# Patient Record
Sex: Female | Born: 1980
Health system: Southern US, Community
[De-identification: ages and names within clinical notes are randomized; demographics above are authoritative.]

## PROBLEM LIST (undated history)

## (undated) DIAGNOSIS — J301 Allergic rhinitis due to pollen: Secondary | ICD-10-CM

## (undated) DIAGNOSIS — K219 Gastro-esophageal reflux disease without esophagitis: Secondary | ICD-10-CM

## (undated) DIAGNOSIS — B019 Varicella without complication: Secondary | ICD-10-CM

## (undated) DIAGNOSIS — R87619 Unspecified abnormal cytological findings in specimens from cervix uteri: Secondary | ICD-10-CM

## (undated) DIAGNOSIS — B977 Papillomavirus as the cause of diseases classified elsewhere: Secondary | ICD-10-CM

## (undated) HISTORY — DX: Unspecified abnormal cytological findings in specimens from cervix uteri: R87.619

## (undated) HISTORY — DX: Papillomavirus as the cause of diseases classified elsewhere: B97.7

## (undated) HISTORY — PX: WISDOM TOOTH EXTRACTION: SHX21

## (undated) HISTORY — DX: Allergic rhinitis due to pollen: J30.1

## (undated) HISTORY — PX: COLONOSCOPY: SHX174

## (undated) HISTORY — PX: CRYOTHERAPY: SHX1416

## (undated) HISTORY — DX: Varicella without complication: B01.9

---

## 2016-11-24 ENCOUNTER — Encounter: Payer: Self-pay | Admitting: Obstetrics and Gynecology

## 2016-11-24 ENCOUNTER — Ambulatory Visit (INDEPENDENT_AMBULATORY_CARE_PROVIDER_SITE_OTHER): Payer: BLUE CROSS/BLUE SHIELD | Admitting: Obstetrics and Gynecology

## 2016-11-24 VITALS — BP 118/74 | Ht 63.0 in | Wt 182.0 lb

## 2016-11-24 DIAGNOSIS — Z124 Encounter for screening for malignant neoplasm of cervix: Secondary | ICD-10-CM | POA: Diagnosis not present

## 2016-11-24 DIAGNOSIS — Z1331 Encounter for screening for depression: Secondary | ICD-10-CM

## 2016-11-24 DIAGNOSIS — Z1339 Encounter for screening examination for other mental health and behavioral disorders: Secondary | ICD-10-CM

## 2016-11-24 DIAGNOSIS — Z01419 Encounter for gynecological examination (general) (routine) without abnormal findings: Secondary | ICD-10-CM | POA: Diagnosis not present

## 2016-11-24 DIAGNOSIS — Z1389 Encounter for screening for other disorder: Secondary | ICD-10-CM

## 2016-11-24 NOTE — Progress Notes (Signed)
Gynecology Annual Exam  PCP: Patient, No Pcp Per  Chief Complaint  Patient presents with  . Annual Exam    History of Present Illness:  [name] is a 36 y.o. [id number] who LMP was No LMP recorded. Patient is not currently having periods (Reason: IUD)., presents today for her annual examination.  Her menses are absent.  She is single partner, contraception - IUD.  Last Pap: several years ago Hx of STDs: denies  Last mammogram: n/a There is no FH of breast cancer. There is no FH of ovarian cancer. The patient does not do self-breast exams.  Tobacco use: The patient denies current or previous tobacco use. Alcohol use: none Exercise: not active  The patient wears seatbelts: yes.      Review of Systems: Review of Systems  Constitutional: Negative.   HENT: Negative.   Eyes: Negative.   Respiratory: Negative.   Cardiovascular: Negative.   Gastrointestinal: Negative.   Genitourinary: Negative.   Musculoskeletal: Negative.   Skin: Negative.   Neurological: Negative.   Psychiatric/Behavioral: Negative.     Past Medical History:  Diagnosis Date  . Abnormal Pap smear of cervix   . Human papilloma virus     Past Surgical History:  Procedure Laterality Date  . CESAREAN SECTION    . CRYOTHERAPY      Medications:   Medication Sig Start Date End Date Taking? Authorizing Provider  cetirizine (ZYRTEC) 10 MG tablet Take 10 mg by mouth daily.   Yes [provider]    Allergies: No Known Allergies  Gynecologic History: No LMP recorded. Patient is not currently having periods (Reason: IUD).  Obstetric History: [id number]  Social History   Social History  . Marital status: Single    Spouse name: N/A  . Number of children: N/A  . Years of education: N/A   Occupational History  . Not on file.   Social History Main Topics  . Smoking status: Never Smoker  . Smokeless tobacco: Never Used  . Alcohol use No  . Drug use: No  . Sexual activity: Yes   Birth control/ protection: IUD   Other Topics Concern  . Not on file   Social History Narrative  . No narrative on file    Family History:  History reviewed. No pertinent family history.   Physical Exam BP 118/74   Ht 5\' 3"  (1.6 m)   Wt 182 lb (82.6 kg)   BMI 32.24 kg/m   Physical Exam  Constitutional: She is oriented to person, place, and time. She appears well-developed and well-nourished. No distress.  Genitourinary: Vagina normal and uterus normal. Pelvic exam was performed with patient supine. There is no rash, tenderness, lesion or injury on the right labia. There is no rash, tenderness, lesion or injury on the left labia. Vagina exhibits no lesion. No bleeding in the vagina. No foreign body in the vagina. No signs of injury around the vagina. Right adnexum does not display mass, does not display tenderness and does not display fullness. Left adnexum does not display mass, does not display tenderness and does not display fullness.  Cervix exhibits visible IUD strings. Cervix does not exhibit motion tenderness, lesion, discharge or polyp.   Uterus is anteverted.  HENT:  Head: Normocephalic and atraumatic.  Eyes: EOM are normal. No scleral icterus.  Neck: Normal range of motion. Neck supple. No thyromegaly present.  Cardiovascular: Normal rate, regular rhythm and normal heart sounds.  Exam reveals no gallop and no friction rub.   No  murmur heard. Pulmonary/Chest: Effort normal and breath sounds normal. No respiratory distress. She has no wheezes. She has no rales.  Abdominal: Soft. Bowel sounds are normal. She exhibits no distension and no mass. There is no tenderness. There is no rebound and no guarding.  Musculoskeletal: Normal range of motion. She exhibits no edema.  Lymphadenopathy:    She has no cervical adenopathy.  Neurological: She is alert and oriented to person, place, and time. No cranial nerve deficit.  Skin: Skin is warm and dry. No erythema.  Psychiatric: She  has a normal mood and affect. Her behavior is normal. Judgment normal.   Female chaperone present for pelvic and breast  portions of the physical exam  Results: AUDIT Questionnaire (screen for alcoholism): 0 PHQ-9: 1  Assessment: 36 y.o. K2H0623 here for annual gynecologic examination, doing well  Plan:  Screening: -- Blood pressure screen normal. -- Colonoscopy - not due -- Mammogram - not due.  -- Weight screening: obesity.  Discussed lifestyle, diet, and exercise. Will continue to monitor. -- Depression screening negative (PHQ-9) -- Nutrition: normal -- cholesterol screening: n/a -- osteoporosis screening: n/a -- tobacco screening: not using -- alcohol screening: AUDIT questionnaire indicates low-risk usage. -- family history of breast cancer screening: done. not at high risk. -- no evidence of domestic violence or intimate partner violence. -- STD screening: gonorrhea/chlamydia NAAT not collected per patient request. -- pap smear collected.  Prentice Docker, MD 11/24/2016 9:57 AM

## 2016-11-25 DIAGNOSIS — Z124 Encounter for screening for malignant neoplasm of cervix: Secondary | ICD-10-CM | POA: Diagnosis not present

## 2016-11-25 DIAGNOSIS — Z01419 Encounter for gynecological examination (general) (routine) without abnormal findings: Secondary | ICD-10-CM | POA: Diagnosis not present

## 2016-12-06 LAB — IGP,CTNG,APTIMAHPV,RFX16/18,45
Chlamydia, Nuc. Acid Amp: NEGATIVE
Gonococcus by Nucleic Acid Amp: NEGATIVE
HPV Aptima: POSITIVE — AB
HPV GENOTYPE 16: NEGATIVE
HPV Genotype 18,45: NEGATIVE
PAP SMEAR COMMENT: 0

## 2016-12-08 ENCOUNTER — Encounter: Payer: Self-pay | Admitting: Obstetrics and Gynecology

## 2018-08-14 ENCOUNTER — Encounter: Payer: Self-pay | Admitting: Family Medicine

## 2018-08-14 ENCOUNTER — Ambulatory Visit: Payer: BLUE CROSS/BLUE SHIELD | Admitting: Family Medicine

## 2018-08-14 VITALS — BP 120/82 | HR 94 | Temp 98.7°F | Resp 18 | Ht 64.5 in | Wt 206.6 lb

## 2018-08-14 DIAGNOSIS — D171 Benign lipomatous neoplasm of skin and subcutaneous tissue of trunk: Secondary | ICD-10-CM | POA: Diagnosis not present

## 2018-08-14 DIAGNOSIS — Z8349 Family history of other endocrine, nutritional and metabolic diseases: Secondary | ICD-10-CM | POA: Diagnosis not present

## 2018-08-14 LAB — LIPID PANEL
Cholesterol: 199 mg/dL (ref 0–200)
HDL: 41.5 mg/dL (ref >39.00)
LDL Cholesterol: 134 mg/dL — ABNORMAL HIGH (ref 0–99)
NonHDL: 157.16
TRIGLYCERIDES: 115 mg/dL (ref 0.0–149.0)
Total CHOL/HDL Ratio: 5
VLDL: 23 mg/dL (ref 0.0–40.0)

## 2018-08-14 LAB — COMPREHENSIVE METABOLIC PANEL
ALT: 17 U/L (ref 0–35)
AST: 16 U/L (ref 0–37)
Albumin: 4.4 g/dL (ref 3.5–5.2)
Alkaline Phosphatase: 54 U/L (ref 39–117)
BUN: 17 mg/dL (ref 6–23)
CHLORIDE: 102 meq/L (ref 96–112)
CO2: 25 meq/L (ref 19–32)
CREATININE: 0.69 mg/dL (ref 0.40–1.20)
Calcium: 9.4 mg/dL (ref 8.4–10.5)
GFR: 95.28 mL/min (ref >60.00)
GLUCOSE: 100 mg/dL — AB (ref 70–99)
Potassium: 4.4 mEq/L (ref 3.5–5.1)
SODIUM: 138 meq/L (ref 135–145)
Total Bilirubin: 0.4 mg/dL (ref 0.2–1.2)
Total Protein: 7.1 g/dL (ref 6.0–8.3)

## 2018-08-14 LAB — CBC
HCT: 43.7 % (ref 35.0–45.0)
HEMOGLOBIN: 15.1 g/dL (ref 11.7–15.5)
MCH: 30.8 pg (ref 27.0–33.0)
MCHC: 34.6 g/dL (ref 32.0–36.0)
MCV: 89 fL (ref 80.0–100.0)
MPV: 11.1 fL (ref 7.5–12.5)
Platelets: 282 10*3/uL (ref 140–400)
RBC: 4.91 10*6/uL (ref 3.80–5.10)
RDW: 11.8 % (ref 11.0–15.0)
WBC: 6.8 10*3/uL (ref 3.8–10.8)

## 2018-08-14 LAB — B12 AND FOLATE PANEL: VITAMIN B 12: 684 pg/mL (ref 211–911)

## 2018-08-14 LAB — VITAMIN D 25 HYDROXY (VIT D DEFICIENCY, FRACTURES): VITD: 30.31 ng/mL (ref 30.00–100.00)

## 2018-08-14 NOTE — Patient Instructions (Signed)
Lipoma    A lipoma is a noncancerous (benign) tumor that is made up of fat cells. This is a very common type of soft-tissue growth. Lipomas are usually found under the skin (subcutaneous). They may occur in any tissue of the body that contains fat. Common areas for lipomas to appear include the back, shoulders, buttocks, and thighs.   Lipomas grow slowly, and they are usually painless. Most lipomas do not cause problems and do not require treatment.  What are the causes?  The cause of this condition is not known.  What increases the risk?  You are more likely to develop this condition if:   You are 40-60 years old.   You have a family history of lipomas.  What are the signs or symptoms?  A lipoma usually appears as a small, round bump under the skin. In most cases, the lump will:   Feel soft or rubbery.   Not cause pain or other symptoms.  However, if a lipoma is located in an area where it pushes on nerves, it can become painful or cause other symptoms.  How is this diagnosed?  A lipoma can usually be diagnosed with a physical exam. You may also have tests to confirm the diagnosis and to rule out other conditions. Tests may include:   Imaging tests, such as a CT scan or MRI.   Removal of a tissue sample to be looked at under a microscope (biopsy).  How is this treated?  Treatment for this condition depends on the size of the lipoma and whether it is causing any symptoms.   For small lipomas that are not causing problems, no treatment is needed.   If a lipoma is bigger or it causes problems, surgery may be done to remove the lipoma. Lipomas can also be removed to improve appearance. Most often, the procedure is done after applying a medicine that numbs the area (local anesthetic).  Follow these instructions at home:   Watch your lipoma for any changes.   Keep all follow-up visits as told by your health care provider. This is important.  Contact a health care provider if:   Your lipoma becomes larger or  hard.   Your lipoma becomes painful, red, or increasingly swollen. These could be signs of infection or a more serious condition.  Get help right away if:   You develop tingling or numbness in an area near the lipoma. This could indicate that the lipoma is causing nerve damage.  Summary   A lipoma is a noncancerous tumor that is made up of fat cells.   Most lipomas do not cause problems and do not require treatment.   If a lipoma is bigger or it causes problems, surgery may be done to remove the lipoma.  This information is not intended to replace advice given to you by your health care provider. Make sure you discuss any questions you have with your health care provider.  Document Released: 06/10/2002 Document Revised: 06/06/2017 Document Reviewed: 06/06/2017  Elsevier Interactive Patient Education  2019 [employer]

## 2018-08-14 NOTE — Progress Notes (Signed)
Subjective:    Patient ID: Terry Henderson, female    DOB: [birth year], 38 y.o.   MRN: [id number]  HPI   Patient presents to clinic to establish with PCP.  Overall she is feeling well.  Does have 1 concern of a lump on the back, states this area has seemed to gotten bigger over the past few weeks.  Area is not painful, red.  Area does not drain any fluid or pus.  Patient's last Pap smear was in 2018, normal.  Next Pap due in 2020.  Past medical, family, social surgical history reviewed and updated in chart.  She has no history of any cancers in her family.  She has 2 children ages 56 and 79.  She is married.  She works in the Retail buyer at [employer].   Past Medical History:  Diagnosis Date  . Abnormal Pap smear of cervix   . Chickenpox   . Hay fever   . Human papilloma virus    Social History   Tobacco Use  . Smoking status: Never Smoker  . Smokeless tobacco: Never Used  Substance Use Topics  . Alcohol use: No   Past Surgical History:  Procedure Laterality Date  . CESAREAN SECTION    . CRYOTHERAPY     Family History  Problem Relation Age of Onset  . Hypertension Mother   . Heart attack Maternal Grandmother   . Hyperlipidemia Maternal Grandmother   . Stroke Maternal Grandmother   . Hyperlipidemia Maternal Grandfather   . Heart attack Maternal Grandfather   . Early death Paternal Grandmother    Review of Systems  Constitutional: Negative for chills, fatigue and fever.  HENT: Negative for congestion, ear pain, sinus pain and sore throat.   Eyes: Negative.   Respiratory: Negative for cough, shortness of breath and wheezing.   Cardiovascular: Negative for chest pain, palpitations and leg swelling.  Gastrointestinal: Negative for abdominal pain, diarrhea, nausea and vomiting.  Genitourinary: Negative for dysuria, frequency and urgency.  Musculoskeletal: Negative for arthralgias and myalgias.  Skin: Negative for color change, pallor and rash. +lump on  back Neurological: Negative for syncope, light-headedness and headaches.  Psychiatric/Behavioral: The patient is not nervous/anxious.       Objective:   Physical Exam Vitals signs and nursing note reviewed.  Constitutional:      Appearance: She is well-developed.  HENT:     Head: Normocephalic and atraumatic.     Right Ear: External ear normal.     Left Ear: External ear normal.     Nose: Nose normal.  Eyes:     General: No scleral icterus.       Right eye: No discharge.        Left eye: No discharge.     Conjunctiva/sclera: Conjunctivae normal.     Pupils: Pupils are equal, round, and reactive to light.  Neck:     Musculoskeletal: Normal range of motion and neck supple.     Trachea: No tracheal deviation.  Cardiovascular:     Rate and Rhythm: Normal rate and regular rhythm.     Heart sounds: Normal heart sounds. No murmur. No friction rub. No gallop.   Pulmonary:     Effort: Pulmonary effort is normal. No respiratory distress.     Breath sounds: Normal breath sounds. No wheezing or rales.  Chest:     Chest wall: No tenderness.  Abdominal:     General: Bowel sounds are normal.     Palpations: Abdomen is soft.  Tenderness: There is no abdominal tenderness. There is no guarding.  Musculoskeletal: Normal range of motion.        General: No deformity.  Lymphadenopathy:     Cervical: No cervical adenopathy.  Skin:    General: Skin is warm and dry.     Capillary Refill: Capillary refill takes less than 2 seconds.     Coloration: Skin is not pale.     Findings: No erythema.          Comments: Slightly raised lump felt on mid back, suspect the area is lipoma.  Its diameter is approximately the size of a golf ball.  Neurological:     Mental Status: She is alert and oriented to person, place, and time.     Cranial Nerves: No cranial nerve deficit.  Psychiatric:        Behavior: Behavior normal.        Thought Content: Thought content normal.     Vitals:   08/14/18  0924  BP: 120/82  Pulse: 94  Resp: 18  Temp: 98.7 F (37.1 C)  SpO2: 93%       Assessment & Plan:   Lipoma of back-suspect that the area is a lipoma.  Patient given information outlining what a lipoma is.  Reassured that these are often benign.  She is interested in doing ultrasound imaging to further evaluate.  We will order ultrasound for patient.  Also discussed that if area were to be removed, it would require general surgery consult.  We will see what ultrasound report tells [place] and then if needed we will do general surgery consult.  Family history of nutritional/metabolic syndrome-due to family histories we will do screening lab work today including CBC, CMP, lipid panel, thyroid panel to monitor patient's levels.  Patient will follow-up annually for wellness visit and when needed.  She is aware of some we will contact her in regards to ultrasound appointment.

## 2018-08-15 LAB — THYROID PANEL WITH TSH
Free Thyroxine Index: 2.4 (ref 1.4–3.8)
T3 UPTAKE: 27 % (ref 22–35)
T4, Total: 9 ug/dL (ref 5.1–11.9)
TSH: 1.3 mIU/L

## 2018-08-17 ENCOUNTER — Ambulatory Visit (INDEPENDENT_AMBULATORY_CARE_PROVIDER_SITE_OTHER): Payer: BLUE CROSS/BLUE SHIELD | Admitting: Obstetrics and Gynecology

## 2018-08-17 ENCOUNTER — Encounter: Payer: Self-pay | Admitting: Obstetrics and Gynecology

## 2018-08-17 ENCOUNTER — Other Ambulatory Visit (HOSPITAL_COMMUNITY)
Admission: RE | Admit: 2018-08-17 | Discharge: 2018-08-17 | Disposition: A | Discharge status: Home / Self Care | Payer: BLUE CROSS/BLUE SHIELD | Source: Ambulatory Visit | Attending: Obstetrics and Gynecology | Admitting: Obstetrics and Gynecology

## 2018-08-17 VITALS — BP 122/74 | Ht 64.0 in | Wt 204.0 lb

## 2018-08-17 DIAGNOSIS — Z113 Encounter for screening for infections with a predominantly sexual mode of transmission: Secondary | ICD-10-CM | POA: Insufficient documentation

## 2018-08-17 DIAGNOSIS — Z124 Encounter for screening for malignant neoplasm of cervix: Secondary | ICD-10-CM

## 2018-08-17 DIAGNOSIS — Z30432 Encounter for removal of intrauterine contraceptive device: Secondary | ICD-10-CM | POA: Diagnosis not present

## 2018-08-17 DIAGNOSIS — Z01419 Encounter for gynecological examination (general) (routine) without abnormal findings: Secondary | ICD-10-CM | POA: Insufficient documentation

## 2018-08-17 DIAGNOSIS — Z1339 Encounter for screening examination for other mental health and behavioral disorders: Secondary | ICD-10-CM

## 2018-08-17 DIAGNOSIS — Z1331 Encounter for screening for depression: Secondary | ICD-10-CM

## 2018-08-17 NOTE — Progress Notes (Signed)
Gynecology Annual Exam  PCP: Jodelle Green, FNP   Chief Complaint  Patient presents with  . Annual Exam   History of Present Illness:  Terry Henderson is a 38 y.o. [id number] who LMP was No LMP recorded. (Menstrual status: IUD)., presents today for her annual examination.  Her menses are absent with her IUD.  She is sexually active. She would like her IUD removed to attempt conception.  Last Pap: 11/2016  Results were: no abnormalities /neg HPV DNA positive (negative for HPV 16 and 18). She has a history of cryotherapy twice in the past.  Hx of STDs: none  Last mammogram:  n/a There is no FH of breast cancer. There is no FH of ovarian cancer. The patient does do self-breast exams.  Tobacco use: The patient denies current or previous tobacco use. Alcohol use: none Exercise: not active  The patient wears seatbelts: yes.      Past Medical History:  Diagnosis Date  . Abnormal Pap smear of cervix   . Chickenpox   . Hay fever   . Human papilloma virus     Past Surgical History:  Procedure Laterality Date  . CESAREAN SECTION    . CRYOTHERAPY      Prior to Admission medications   Medication Sig Start Date End Date Taking? Authorizing Provider  levonorgestrel (MIRENA) 20 MCG/24HR IUD 1 each by Intrauterine route once.   Yes [provider]   Allergies: No Known Allergies  Gynecologic History: No LMP recorded. (Menstrual status: IUD).  Obstetric History: [id number], s/p c-section x2  Social History   Socioeconomic History  . Marital status: Married    Spouse name: Not on file  . Number of children: Not on file  . Years of education: Not on file  . Highest education level: Not on file  Occupational History  . Not on file  Social Needs  . Financial resource strain: Not on file  . Food insecurity:    Worry: Not on file    Inability: Not on file  . Transportation needs:    Medical: Not on file    Non-medical: Not on file  Tobacco Use  . Smoking status: Never  Smoker  . Smokeless tobacco: Never Used  Substance and Sexual Activity  . Alcohol use: No  . Drug use: No  . Sexual activity: Yes    Birth control/protection: I.U.D.  Lifestyle  . Physical activity:    Days per week: Not on file    Minutes per session: Not on file  . Stress: Not on file  Relationships  . Social connections:    Talks on phone: Not on file    Gets together: Not on file    Attends religious service: Not on file    Active member of club or organization: Not on file    Attends meetings of clubs or organizations: Not on file    Relationship status: Not on file  . Intimate partner violence:    Fear of current or ex partner: Not on file    Emotionally abused: Not on file    Physically abused: Not on file    Forced sexual activity: Not on file  Other Topics Concern  . Not on file  Social History Narrative  . Not on file    Family History  Problem Relation Age of Onset  . Hypertension Mother   . Heart attack Maternal Grandmother   . Hyperlipidemia Maternal Grandmother   . Stroke Maternal Grandmother   .  Hyperlipidemia Maternal Grandfather   . Heart attack Maternal Grandfather   . Early death Paternal Grandmother     Review of Systems  Constitutional: Negative.   HENT: Negative.   Eyes: Negative.   Respiratory: Negative.   Cardiovascular: Negative.   Gastrointestinal: Negative.   Genitourinary: Negative.   Musculoskeletal: Negative.   Skin: Negative.   Neurological: Negative.   Psychiatric/Behavioral: Negative.      Physical Exam BP 122/74   Ht 5\' 4"  (1.626 m)   Wt 204 lb (92.5 kg)   BMI 35.02 kg/m    Physical Exam Constitutional:      General: She is not in acute distress.    Appearance: She is well-developed.  Genitourinary:     Pelvic exam was performed with patient supine.     Uterus normal.     No inguinal adenopathy present in the right or left side.    No signs of injury in the vagina.     No vaginal discharge, erythema, tenderness  or bleeding.     No cervical motion tenderness, discharge, lesion or polyp.     Uterus is mobile.     Uterus is not enlarged or tender.     No uterine mass detected.    Uterus is anteverted.     No right or left adnexal mass present.     Right adnexa not tender or full.     Left adnexa not tender or full.  HENT:     Head: Normocephalic and atraumatic.  Eyes:     General: No scleral icterus. Neck:     Musculoskeletal: Normal range of motion and neck supple.     Thyroid: No thyromegaly.  Cardiovascular:     Rate and Rhythm: Normal rate and regular rhythm.     Heart sounds: No murmur. No friction rub. No gallop.   Pulmonary:     Effort: Pulmonary effort is normal. No respiratory distress.     Breath sounds: Normal breath sounds. No wheezing or rales.  Chest:     Breasts:        Right: No inverted nipple, mass, nipple discharge, skin change or tenderness.        Left: No inverted nipple, mass, nipple discharge, skin change or tenderness.  Abdominal:     General: Bowel sounds are normal. There is no distension.     Palpations: Abdomen is soft. There is no mass.     Tenderness: There is no abdominal tenderness. There is no guarding or rebound.  Musculoskeletal: Normal range of motion.        General: No tenderness.  Lymphadenopathy:     Cervical: No cervical adenopathy.     Lower Body: No right inguinal adenopathy. No left inguinal adenopathy.  Neurological:     Mental Status: She is alert and oriented to person, place, and time.     Cranial Nerves: No cranial nerve deficit.  Skin:    General: Skin is warm and dry.     Findings: No erythema or rash.  Psychiatric:        Behavior: Behavior normal.        Judgment: Judgment normal.    IUD Removal  Patient identified, informed consent performed, consent signed.  Patient was in the dorsal lithotomy position, normal external genitalia was noted.  A speculum was placed in the patient's vagina, normal discharge was noted, no  lesions. The cervix was visualized, no lesions, no abnormal discharge.  The strings of the IUD were grasped and  pulled using ring forceps. The IUD was removed in its entirety. Patient tolerated the procedure well.    Patient will use plans for pregnancy soon and she was told to avoid teratogens, take PNV and folic acid.  Routine preventative health maintenance measures emphasized.  Female chaperone present for pelvic and breast  portions of the physical exam  Results: AUDIT Questionnaire (screen for alcoholism): 0 PHQ-9: 0   Assessment: 38 y.o. [id number] female here for routine annual gynecologic examination.  Plan: Problem List Items Addressed This Visit    None    Visit Diagnoses    Women's annual routine gynecological examination    -  Primary   Relevant Orders   Cytology - PAP   Screening for depression       Screening for alcoholism       Pap smear for cervical cancer screening       Relevant Orders   Cytology - PAP   Screen for STD (sexually transmitted disease)       Relevant Orders   Cytology - PAP   Encounter for IUD removal          Screening: -- Blood pressure screen normal -- Colonoscopy - not due -- Mammogram - not due -- Weight screening: obese: discussed management options, including lifestyle, dietary, and exercise. -- Depression screening negative (PHQ-9) -- Nutrition: normal -- cholesterol screening: not due for screening -- osteoporosis screening: not due -- tobacco screening: not using -- alcohol screening: AUDIT questionnaire indicates low-risk usage. -- family history of breast cancer screening: done. not at high risk. -- no evidence of domestic violence or intimate partner violence. -- STD screening: gonorrhea/chlamydia NAAT collected -- pap smear collected per ASCCP guidelines -- flu vaccine declines -- HPV vaccination series: not eligilbe   IUD removal: IUD removed today.  Pre-conception anticipatory counseling provided.  Discussed use of PNVs  and avoidance of teratogens and living health, as if she were already pregnant.   Prentice Docker, MD 08/17/2018 5:26 PM

## 2018-08-21 LAB — CYTOLOGY - PAP
CHLAMYDIA, DNA PROBE: NEGATIVE
Diagnosis: NEGATIVE
HPV: NOT DETECTED
Neisseria Gonorrhea: NEGATIVE

## 2018-09-05 ENCOUNTER — Other Ambulatory Visit: Payer: Self-pay

## 2018-09-05 ENCOUNTER — Ambulatory Visit
Admission: RE | Admit: 2018-09-05 | Discharge: 2018-09-05 | Disposition: A | Discharge status: Home / Self Care | Payer: BLUE CROSS/BLUE SHIELD | Source: Ambulatory Visit | Attending: Family Medicine | Admitting: Family Medicine

## 2018-09-05 DIAGNOSIS — R221 Localized swelling, mass and lump, neck: Secondary | ICD-10-CM | POA: Diagnosis not present

## 2018-09-05 DIAGNOSIS — D171 Benign lipomatous neoplasm of skin and subcutaneous tissue of trunk: Secondary | ICD-10-CM | POA: Insufficient documentation

## 2018-09-10 ENCOUNTER — Encounter: Payer: Self-pay | Admitting: Family Medicine

## 2018-11-20 ENCOUNTER — Ambulatory Visit (INDEPENDENT_AMBULATORY_CARE_PROVIDER_SITE_OTHER): Payer: BLUE CROSS/BLUE SHIELD | Admitting: Obstetrics & Gynecology

## 2018-11-20 ENCOUNTER — Other Ambulatory Visit (HOSPITAL_COMMUNITY)
Admission: RE | Admit: 2018-11-20 | Discharge: 2018-11-20 | Disposition: A | Discharge status: Home / Self Care | Payer: BLUE CROSS/BLUE SHIELD | Source: Ambulatory Visit | Attending: Obstetrics & Gynecology | Admitting: Obstetrics & Gynecology

## 2018-11-20 ENCOUNTER — Encounter: Payer: Self-pay | Admitting: Obstetrics & Gynecology

## 2018-11-20 ENCOUNTER — Other Ambulatory Visit: Payer: Self-pay

## 2018-11-20 VITALS — BP 100/70 | Wt 212.0 lb

## 2018-11-20 DIAGNOSIS — Z3201 Encounter for pregnancy test, result positive: Secondary | ICD-10-CM

## 2018-11-20 DIAGNOSIS — O34219 Maternal care for unspecified type scar from previous cesarean delivery: Secondary | ICD-10-CM

## 2018-11-20 DIAGNOSIS — Z98891 History of uterine scar from previous surgery: Secondary | ICD-10-CM | POA: Insufficient documentation

## 2018-11-20 DIAGNOSIS — O99211 Obesity complicating pregnancy, first trimester: Secondary | ICD-10-CM

## 2018-11-20 DIAGNOSIS — O09529 Supervision of elderly multigravida, unspecified trimester: Secondary | ICD-10-CM | POA: Insufficient documentation

## 2018-11-20 DIAGNOSIS — Z3490 Encounter for supervision of normal pregnancy, unspecified, unspecified trimester: Secondary | ICD-10-CM

## 2018-11-20 DIAGNOSIS — Z1379 Encounter for other screening for genetic and chromosomal anomalies: Secondary | ICD-10-CM

## 2018-11-20 DIAGNOSIS — Z131 Encounter for screening for diabetes mellitus: Secondary | ICD-10-CM

## 2018-11-20 DIAGNOSIS — O09521 Supervision of elderly multigravida, first trimester: Secondary | ICD-10-CM

## 2018-11-20 DIAGNOSIS — O99210 Obesity complicating pregnancy, unspecified trimester: Secondary | ICD-10-CM | POA: Insufficient documentation

## 2018-11-20 DIAGNOSIS — N926 Irregular menstruation, unspecified: Secondary | ICD-10-CM

## 2018-11-20 DIAGNOSIS — O0990 Supervision of high risk pregnancy, unspecified, unspecified trimester: Secondary | ICD-10-CM | POA: Insufficient documentation

## 2018-11-20 DIAGNOSIS — Z3A01 Less than 8 weeks gestation of pregnancy: Secondary | ICD-10-CM

## 2018-11-20 LAB — POCT URINALYSIS DIPSTICK OB
Glucose, UA: NEGATIVE
POC,PROTEIN,UA: NEGATIVE

## 2018-11-20 LAB — POCT URINE PREGNANCY: Preg Test, Ur: POSITIVE — AB

## 2018-11-20 NOTE — Patient Instructions (Signed)
First Trimester of Pregnancy The first trimester of pregnancy is from week 1 until the end of week 13 (months 1 through 3). A week after a sperm fertilizes an egg, the egg will implant on the wall of the uterus. This embryo will begin to develop into a baby. Genes from you and your partner will form the baby. The female genes will determine whether the baby will be a boy or a girl. At 6-8 weeks, the eyes and face will be formed, and the heartbeat can be seen on ultrasound. At the end of 12 weeks, all the baby's organs will be formed. Now that you are pregnant, you will want to do everything you can to have a healthy baby. Two of the most important things are to get good prenatal care and to follow your health care provider's instructions. Prenatal care is all the medical care you receive before the baby's birth. This care will help prevent, find, and treat any problems during the pregnancy and childbirth. Body changes during your first trimester Your body goes through many changes during pregnancy. The changes vary from woman to woman.  You may gain or lose a couple of pounds at first.  You may feel sick to your stomach (nauseous) and you may throw up (vomit). If the vomiting is uncontrollable, call your health care provider.  You may tire easily.  You may develop headaches that can be relieved by medicines. All medicines should be approved by your health care provider.  You may urinate more often. Painful urination may mean you have a bladder infection.  You may develop heartburn as a result of your pregnancy.  You may develop constipation because certain hormones are causing the muscles that push stool through your intestines to slow down.  You may develop hemorrhoids or swollen veins (varicose veins).  Your breasts may begin to grow larger and become tender. Your nipples may stick out more, and the tissue that surrounds them (areola) may become darker.  Your gums may bleed and may be  sensitive to brushing and flossing.  Dark spots or blotches (chloasma, mask of pregnancy) may develop on your face. This will likely fade after the baby is born.  Your menstrual periods will stop.  You may have a loss of appetite.  You may develop cravings for certain kinds of food.  You may have changes in your emotions from day to day, such as being excited to be pregnant or being concerned that something may go wrong with the pregnancy and baby.  You may have more vivid and strange dreams.  You may have changes in your hair. These can include thickening of your hair, rapid growth, and changes in texture. Some women also have hair loss during or after pregnancy, or hair that feels dry or thin. Your hair will most likely return to normal after your baby is born. What to expect at prenatal visits During a routine prenatal visit:  You will be weighed to make sure you and the baby are growing normally.  Your blood pressure will be taken.  Your abdomen will be measured to track your baby's growth.  The fetal heartbeat will be listened to between weeks 10 and 14 of your pregnancy.  Test results from any previous visits will be discussed. Your health care provider may ask you:  How you are feeling.  If you are feeling the baby move.  If you have had any abnormal symptoms, such as leaking fluid, bleeding, severe headaches, or abdominal  cramping.  If you are using any tobacco products, including cigarettes, chewing tobacco, and electronic cigarettes.  If you have any questions. Other tests that may be performed during your first trimester include:  Blood tests to find your blood type and to check for the presence of any previous infections. The tests will also be used to check for low iron levels (anemia) and protein on red blood cells (Rh antibodies). Depending on your risk factors, or if you previously had diabetes during pregnancy, you may have tests to check for high blood sugar  that affects pregnant women (gestational diabetes).  Urine tests to check for infections, diabetes, or protein in the urine.  An ultrasound to confirm the proper growth and development of the baby.  Fetal screens for spinal cord problems (spina bifida) and Down syndrome.  HIV (human immunodeficiency virus) testing. Routine prenatal testing includes screening for HIV, unless you choose not to have this test.  You may need other tests to make sure you and the baby are doing well. Follow these instructions at home: Medicines  Follow your health care provider's instructions regarding medicine use. Specific medicines may be either safe or unsafe to take during pregnancy.  Take a prenatal vitamin that contains at least 600 micrograms (mcg) of folic acid.  If you develop constipation, try taking a stool softener if your health care provider approves. Eating and drinking   Eat a balanced diet that includes fresh fruits and vegetables, whole grains, good sources of protein such as meat, eggs, or tofu, and low-fat dairy. Your health care provider will help you determine the amount of weight gain that is right for you.  Avoid raw meat and uncooked cheese. These carry germs that can cause birth defects in the baby.  Eating four or five small meals rather than three large meals a day may help relieve nausea and vomiting. If you start to feel nauseous, eating a few soda crackers can be helpful. Drinking liquids between meals, instead of during meals, also seems to help ease nausea and vomiting.  Limit foods that are high in fat and processed sugars, such as fried and sweet foods.  To prevent constipation: ? Eat foods that are high in fiber, such as fresh fruits and vegetables, whole grains, and beans. ? Drink enough fluid to keep your urine clear or pale yellow. Activity  Exercise only as directed by your health care provider. Most women can continue their usual exercise routine during  pregnancy. Try to exercise for 30 minutes at least 5 days a week. Exercising will help you: ? Control your weight. ? Stay in shape. ? Be prepared for labor and delivery.  Experiencing pain or cramping in the lower abdomen or lower back is a good sign that you should stop exercising. Check with your health care provider before continuing with normal exercises.  Try to avoid standing for long periods of time. Move your legs often if you must stand in one place for a long time.  Avoid heavy lifting.  Wear low-heeled shoes and practice good posture.  You may continue to have sex unless your health care provider tells you not to. Relieving pain and discomfort  Wear a good support bra to relieve breast tenderness.  Take warm sitz baths to soothe any pain or discomfort caused by hemorrhoids. Use hemorrhoid cream if your health care provider approves.  Rest with your legs elevated if you have leg cramps or low back pain.  If you develop varicose veins in  your legs, wear support hose. Elevate your feet for 15 minutes, 3-4 times a day. Limit salt in your diet. Prenatal care  Schedule your prenatal visits by the twelfth week of pregnancy. They are usually scheduled monthly at first, then more often in the last 2 months before delivery.  Write down your questions. Take them to your prenatal visits.  Keep all your prenatal visits as told by your health care provider. This is important. Safety  Wear your seat belt at all times when driving.  Make a list of emergency phone numbers, including numbers for family, friends, the hospital, and police and fire departments. General instructions  Ask your health care provider for a referral to a local prenatal education class. Begin classes no later than the beginning of month 6 of your pregnancy.  Ask for help if you have counseling or nutritional needs during pregnancy. Your health care provider can offer advice or refer you to specialists for help  with various needs.  Do not use hot tubs, steam rooms, or saunas.  Do not douche or use tampons or scented sanitary pads.  Do not cross your legs for long periods of time.  Avoid cat litter boxes and soil used by cats. These carry germs that can cause birth defects in the baby and possibly loss of the fetus by miscarriage or stillbirth.  Avoid all smoking, herbs, alcohol, and medicines not prescribed by your health care provider. Chemicals in these products affect the formation and growth of the baby.  Do not use any products that contain nicotine or tobacco, such as cigarettes and e-cigarettes. If you need help quitting, ask your health care provider. You may receive counseling support and other resources to help you quit.  Schedule a dentist appointment. At home, brush your teeth with a soft toothbrush and be gentle when you floss. Contact a health care provider if:  You have dizziness.  You have mild pelvic cramps, pelvic pressure, or nagging pain in the abdominal area.  You have persistent nausea, vomiting, or diarrhea.  You have a bad smelling vaginal discharge.  You have pain when you urinate.  You notice increased swelling in your face, hands, legs, or ankles.  You are exposed to fifth disease or chickenpox.  You are exposed to German measles (rubella) and have never had it. Get help right away if:  You have a fever.  You are leaking fluid from your vagina.  You have spotting or bleeding from your vagina.  You have severe abdominal cramping or pain.  You have rapid weight gain or loss.  You vomit blood or material that looks like coffee grounds.  You develop a severe headache.  You have shortness of breath.  You have any kind of trauma, such as from a fall or a car accident. Summary  The first trimester of pregnancy is from week 1 until the end of week 13 (months 1 through 3).  Your body goes through many changes during pregnancy. The changes vary from  woman to woman.  You will have routine prenatal visits. During those visits, your health care provider will examine you, discuss any test results you may have, and talk with you about how you are feeling. This information is not intended to replace advice given to you by your health care provider. Make sure you discuss any questions you have with your health care provider. Document Released: 06/14/2001 Document Revised: 06/01/2016 Document Reviewed: 06/01/2016 Elsevier Interactive Patient Education  2019 [employer]      Hello,  Given the current COVID-19 pandemic, our practice is making changes in how we are providing care to our patients. We are limiting in-person visits for the safety of all of our patients.   As a practice, we have met to discuss the best way to minimize visits, but still provide excellent care to our expecting mothers.  We have decided on the following visit structure for low-risk pregnancies.  Initial Pregnancy visit will be conducted as a telephone or web visit.  Between 10-14 weeks  there will be one in-person visit for an ultrasound, lab work, and genetic screening. 20 weeks in-person visit with an anatomy ultrasound  28 weeks in-person office visit for a 1-hour glucose test and a TDAP vaccination 32 weeks in-person office visit 34 weeks telephone visit 36 weeks in-person office visit for GBS, chlamydia, and gonorrhea testing 38 weeks in-person office visit 40 weeks in-person office visit  Understandably, some patients will require more visits than what is outlined above. Additional visits will be determined on a case-by-case basis.   We will, as always, be available for emergencies or to address concerns that might arise between in-person visits. We ask that you allow [place] the opportunity to address any concerns over the phone or through a virtual visit first. We will be available to return your phone calls throughout the day.   If you are able to purchase a  scale, a blood pressure machine, and a home fetal doppler visits could be limited further. This will help decrease your exposure risks, but these purchases are not a necessity.   Things seem to change daily and there is the possibility that this structure could change, please be patient as we adapt to a new way of caring for patients.   Thank you for trusting [place] with your prenatal care. Our practice values you and looks forward to providing you with excellent care.   Sincerely,   [facility], [place]    COVID-19 and Your Pregnancy FAQ  How can I prevent infection with COVID-19 during my pregnancy? Social distancing is key. Please limit any interactions in public. Try and work from home if possible. Frequently wash your hands after touching possibly contaminated surfaces. Avoid touching your face.  Minimize trips to the store. Consider online ordering when possible.   Should I wear a mask? YES. It is recommended by the CDC that all people wear a cloth mask or facial covering in public. You should wear a mask to your visits in the office. This will help reduce transmission as well as your risk or acquiring COVID-19. New studies are showing that even asymptomatic individuals can spread the virus from talking.   Where can I get a mask? [place] and the city of [place] are partnering to provide masks to community members. You can pick up a mask from several locations. This website also has instructions about how to make a mask by sewing or without sewing by using a t-shirt or bandana.  https://www.[place].gov/i-want-to/learn-about/covid-19-information-and-updates/covid-19-face-mask-project  Studies have shown that if you were a tube or nylon stocking from pantyhose over a cloth mask it makes the cloth mask almost as effective as a N95 mask.   https://www.davis-walter.com/  What are the symptoms of COVID-19? Fever (greater than 100.4 F), dry cough, shortness of breath.  Am I more at risk for COVID-19 since I am pregnant? There is not currently data showing that pregnant women are more adversely impacted by COVID-19 than the general population. However, we  know that pregnant women tend to have worse respiratory complications from similar diseases such as the flu and SARS and for this reason should be considered an at-risk population.  What do I do if I am experiencing the symptoms of COVID-19? Testing is being limited because of test availability. If you are experiencing symptoms you should quarantine yourself, and the members of your family, for at least 2 weeks at home.   Please visit this website for more information: [email or web address].[email or web address]  When should I go to the Emergency Room? Please go to the emergency room if you are experiencing ANY of these symptoms*:  1.    Difficulty breathing or shortness of breath 2.    Persistent pain or pressure in the chest 3.    Confusion or difficulty being aroused (or awakened) 4.    Bluish lips or face  *This list is not all inclusive. Please consult our office for any other symptoms that are severe or concerning.  What do I do if I am having difficulty breathing? You should go to the Emergency Room for evaluation. At this time they have a tent set up for evaluating patients with COVID-19 symptoms.   How will my prenatal care be different because of the COVID-19 pandemic? It has been recommended to reduce the frequency of face-to-face visits and use resources such as telephone and virtual visits when possible. Using a scale, blood pressure machine and fetal doppler at home can further help reduce face-to-face visits. You  will be provided with additional information on this topic.  We ask that you come to your visits alone to minimize potential exposures to  COVID-19.  How can I receive childbirth education? At this time in-person classes have been cancelled. You can register for online childbirth education, breastfeeding, and newborn care classes.  Please visit:  [email or web address] for more information  How will my hospital birth experience be different? The hospital is currently limiting visitors. This means that while you are in labor you can only have one person at the hospital with you. Additional family members will not be allowed to wait in the building or outside your room. Your one support person can be the father of the baby, a relative, a doula, or a friend. Once one support person is designated that person will wear a band. This band cannot be shared with multiple people.  Nitrous Gas is not being offered for pain relief since the tubing and filter for the machine can not be sanitized in a way to guarantee prevention of transmission of COVID-19.  Nasal cannula use of oxygen for fetal indications has also been discontinued.  Currently a clear plastic sheet is being hung between mom and the delivering provider during pushing and delivery to help prevent transmission of COVID-19.      How long will I stay in the hospital for after giving birth? It is also recommended that discharge home be expedited during the COVID-19 outbreak. This means staying for 1 day after a vaginal delivery and 2 days after a cesarean section. Patients who need to stay longer for medical reasons are allowed to do so, but the goal will be for expedited discharge home.   What if I have COVID-19 and I am in labor? We ask that you wear a mask while on labor and delivery. We will try and accommodate you being placed in a room that is capable of filtering the air. Please call ahead if you are in labor and on your way to  the  hospital. The phone number for labor and delivery at [facility] is [phone].  If I have COVID-19 when my baby is born how can I prevent my baby from contracting COVID-19? This is an issue that will have to be discussed on a case-by-case basis. Current recommendations suggest providing separate isolation rooms for both the mother and new infant as well as limiting visitors. However, there are practical challenges to this recommendation. The situation will assuredly change and decisions will be influenced by the desires of the mother and availability of space.  Some suggestions are the use of a curtain or physical barrier between mom and infant, hand hygiene, mom wearing a mask, or 6 feet of spacing between a mom and infant.   Can I breastfeed during the COVID-19 pandemic?   Yes, breastfeeding is encouraged.  Can I breastfeed if I have COVID-19? Yes. Covid-19 has not been found in breast milk. This means you cannot give COVID-19 to your child through breast milk. Breast feeding will also help pass antibodies to fight infection to your baby.   What precautions should I take when breastfeeding if I have COVID-19? If a mother and newborn do room-in and the mother wishes to feed at the breast, she should put on a facemask and practice hand hygiene before each feeding.  What precautions should I take when pumping if I have COVID-19? Prior to expressing breast milk, mothers should practice hand hygiene. After each pumping session, all parts that come into contact with breast milk should be thoroughly washed and the entire pump should be appropriately disinfected per the manufacturer's instructions. This expressed breast milk should be fed to the newborn by a healthy caregiver.  What if I am pregnant and work in healthcare? Based on limited data regarding COVID-19 and pregnancy, ACOG currently does not propose creating additional restrictions on pregnant health care personnel  because of COVID-19 alone. Pregnant women do not appear to be at higher risk of severe disease related to COVID-19. Pregnant health care personnel should follow CDC risk assessment and infection control guidelines for health care personnel exposed to patients with suspected or confirmed COVID-19. Adherence to recommended infection prevention and control practices is an important part of protecting all health care personnel in health care settings.    Information on COVID-19 in pregnancy is very limited; however, facilities may want to consider limiting exposure of pregnant health care personnel to patients with confirmed or suspected COVID-19 infection, especially during higher-risk procedures (eg, aerosol-generating procedures), if feasible, based on staffing availability.

## 2018-11-20 NOTE — Progress Notes (Signed)
11/20/2018   Chief Complaint: Missed period  Transfer of Care Patient: no  History of Present Illness: Ms. Gladwin is a 38 y.o. [id number] [redacted]w[redacted]d based on Patient's last menstrual period was 10/12/2018 (exact date). with an Estimated Date of Delivery: 07/19/19, with the above CC.   Her periods were: Regular x 2 after IUD removed in Feb 2020 with the intention for pregnancy She was using no method when she conceived.  She has Positive signs or symptoms of nausea/vomiting of pregnancy. She has Negative signs or symptoms of miscarriage or preterm labor She identifies Negative Zika risk factors for her and her partner On any different medications around the time she conceived/early pregnancy: No  History of varicella: Yes   ROS: A 12-point review of systems was performed and negative, except as stated in the above HPI.  OBGYN History: As per HPI. OB History  Gravida Para Term Preterm AB Living  4 2 2   1 2   SAB TAB Ectopic Multiple Live Births  1       2    # Outcome Date GA Lbr Len/2nd Weight Sex Delivery Anes PTL Lv  4 Current           3 Term     F CS-LTranv   LIV  2 Term     M CS-LTranv   LIV  1 SAB             Any issues with any prior pregnancies: yes- CS x2, first for CPD after pushing 2+ hours, Ages 33 and 8.  This is different father of baby. Any prior children are healthy, doing well, without any problems or issues: yes History of pap smears: Yes. Last pap smear 08/2018. Abnormal: no  History of STIs: No   Past Medical History: Past Medical History:  Diagnosis Date  . Abnormal Pap smear of cervix   . Chickenpox   . Hay fever   . Human papilloma virus     Past Surgical History: Past Surgical History:  Procedure Laterality Date  . CESAREAN SECTION    . CRYOTHERAPY      Family History:  Family History  Problem Relation Age of Onset  . Hypertension Mother   . Heart attack Maternal Grandmother   . Hyperlipidemia Maternal Grandmother   . Stroke Maternal Grandmother    . Hyperlipidemia Maternal Grandfather   . Heart attack Maternal Grandfather   . Early death Paternal Grandmother    She denies any female cancers, bleeding or blood clotting disorders.  She denies any history of mental retardation, birth defects or genetic disorders in her or the FOB's history  Social History:  Social History   Socioeconomic History  . Marital status: Married    Spouse name: Not on file  . Number of children: Not on file  . Years of education: Not on file  . Highest education level: Not on file  Occupational History  . Not on file  Social Needs  . Financial resource strain: Not on file  . Food insecurity:    Worry: Not on file    Inability: Not on file  . Transportation needs:    Medical: Not on file    Non-medical: Not on file  Tobacco Use  . Smoking status: Never Smoker  . Smokeless tobacco: Never Used  Substance and Sexual Activity  . Alcohol use: No  . Drug use: No  . Sexual activity: Yes    Birth control/protection: I.U.D.  Lifestyle  . Physical activity:  Days per week: Not on file    Minutes per session: Not on file  . Stress: Not on file  Relationships  . Social connections:    Talks on phone: Not on file    Gets together: Not on file    Attends religious service: Not on file    Active member of club or organization: Not on file    Attends meetings of clubs or organizations: Not on file    Relationship status: Not on file  . Intimate partner violence:    Fear of current or ex partner: Not on file    Emotionally abused: Not on file    Physically abused: Not on file    Forced sexual activity: Not on file  Other Topics Concern  . Not on file  Social History Narrative  . Not on file   Any pets in the household: no  Allergy: No Known Allergies  Current Outpatient Medications: No current outpatient medications on file.   Physical Exam:   BP 100/70   Wt 212 lb (96.2 kg)   LMP 10/12/2018 (Exact Date)   BMI 36.39 kg/m  Body  mass index is 36.39 kg/m. Constitutional: Well nourished, well developed female in no acute distress.  Neck:  Supple, normal appearance, and no thyromegaly  Cardiovascular: S1, S2 normal, no murmur, rub or gallop, regular rate and rhythm Respiratory:  Clear to auscultation bilateral. Normal respiratory effort Abdomen: positive bowel sounds and no masses, hernias; diffusely non tender to palpation, non distended Breasts: breasts appear normal, no suspicious masses, no skin or nipple changes or axillary nodes. Neuro/Psych:  Normal mood and affect.  Skin:  Warm and dry.  Lymphatic:  No inguinal lymphadenopathy.   Pelvic exam: is not limited by body habitus EGBUS: within normal limits, Vagina: within normal limits and with no blood in the vault, Cervix: normal appearing cervix without discharge or lesions, closed/long/high, Uterus:  enlarged: minimally, and Adnexa:  no mass, fullness, tenderness  Assessment: Ms. Lablanc is a 38 y.o. [id number] [redacted]w[redacted]d based on Patient's last menstrual period was 10/12/2018 (exact date). with an Estimated Date of Delivery: 07/19/19,  for prenatal care.  Plan:  1) Avoid alcoholic beverages. 2) Patient encouraged not to smoke.  3) Discontinue the use of all non-medicinal drugs and chemicals.  4) Take prenatal vitamins daily.  5) Seatbelt use advised 6) Nutrition, food safety (fish, cheese advisories, and high nitrite foods) and exercise discussed. 7) Hospital and practice style delivering at [facility] discussed  8) Patient is asked about travel to areas at risk for the [place] virus, and counseled to avoid travel and exposure to mosquitoes or sexual partners who may have themselves been exposed to the virus. Testing is discussed, and will be ordered as appropriate.  9) [facility]birth classes at [facility] advised 10) Genetic Screening, such as with 1st Trimester Screening, cell free fetal DNA, AFP testing, and Ultrasound, as well as with amniocentesis and CVS as appropriate, is discussed  with patient. She plans to have genetic testing this pregnancy. 11) AMA, testing 12) Prior CS, desires CS 13) Obesity discussed. Early Glucola to be arranged. 14) PAP UTD.  Labs at 10 week visit w NIPT and Glucola too. 15) [place] 7 weeks for dating.  Problem list reviewed and updated.  Barnett Applebaum, MD, [facility], [facility] 11/20/2018  9:11 AM

## 2018-11-21 LAB — CERVICOVAGINAL ANCILLARY ONLY
Chlamydia: NEGATIVE
Neisseria Gonorrhea: NEGATIVE
Trichomonas: NEGATIVE

## 2018-11-22 LAB — URINE CULTURE

## 2018-12-13 ENCOUNTER — Ambulatory Visit (INDEPENDENT_AMBULATORY_CARE_PROVIDER_SITE_OTHER): Payer: BC Managed Care – PPO

## 2018-12-13 ENCOUNTER — Other Ambulatory Visit: Payer: Self-pay

## 2018-12-13 ENCOUNTER — Encounter: Payer: Self-pay | Admitting: Obstetrics and Gynecology

## 2018-12-13 ENCOUNTER — Ambulatory Visit (INDEPENDENT_AMBULATORY_CARE_PROVIDER_SITE_OTHER): Payer: BC Managed Care – PPO | Admitting: Obstetrics and Gynecology

## 2018-12-13 ENCOUNTER — Other Ambulatory Visit: Payer: Self-pay | Admitting: Obstetrics & Gynecology

## 2018-12-13 DIAGNOSIS — Z3490 Encounter for supervision of normal pregnancy, unspecified, unspecified trimester: Secondary | ICD-10-CM

## 2018-12-13 DIAGNOSIS — O99210 Obesity complicating pregnancy, unspecified trimester: Secondary | ICD-10-CM

## 2018-12-13 DIAGNOSIS — N926 Irregular menstruation, unspecified: Secondary | ICD-10-CM

## 2018-12-13 DIAGNOSIS — O3481 Maternal care for other abnormalities of pelvic organs, first trimester: Secondary | ICD-10-CM

## 2018-12-13 DIAGNOSIS — Z3A08 8 weeks gestation of pregnancy: Secondary | ICD-10-CM

## 2018-12-13 DIAGNOSIS — O99211 Obesity complicating pregnancy, first trimester: Secondary | ICD-10-CM

## 2018-12-13 DIAGNOSIS — O09521 Supervision of elderly multigravida, first trimester: Secondary | ICD-10-CM

## 2018-12-13 DIAGNOSIS — Z98891 History of uterine scar from previous surgery: Secondary | ICD-10-CM

## 2018-12-13 DIAGNOSIS — O34219 Maternal care for unspecified type scar from previous cesarean delivery: Secondary | ICD-10-CM

## 2018-12-13 NOTE — Progress Notes (Signed)
    Routine Prenatal Care Visit- Virtual Visit  Subjective   Virtual Visit via Telephone Note  I connected with  on 12/13/18 at  1:50 PM EDT by telephone and verified that I am speaking with the correct person using two identifiers.   I discussed the limitations, risks, security and privacy concerns of performing an evaluation and management service by telephone and the availability of in person appointments. I also discussed with the patient that there may be a patient responsible charge related to this service. The patient expressed understanding and agreed to proceed.  The patient was at home I spoke with the patient from my  office The names of people involved in this encounter were: Tedra Senegal and Dr. Gilman Schmidt.   Terry Henderson is a 38 y.o. [phone] at [redacted]w[redacted]d being seen today for ongoing prenatal care.  She is currently monitored for the following issues for this high-risk pregnancy and has History of 2 cesarean sections; Encounter for supervision of low-risk pregnancy, antepartum; AMA (advanced maternal age) multigravida 35+, first trimester; and Obesity affecting pregnancy, antepartum on their problem list.  ----------------------------------------------------------------------------------- Patient reports no complaints.   Contractions: Not present. Vag. Bleeding: None.  Movement: Absent. Denies leaking of fluid.  ----------------------------------------------------------------------------------- The following portions of the patient's history were reviewed and updated as appropriate: allergies, current medications, past family history, past medical history, past social history, past surgical history and problem list. Problem list updated.   Objective  Last menstrual period 10/12/2018. Pregravid weight 212 lb (96.2 kg) Total Weight Gain 0 lb (0 kg) Urinalysis:      Fetal Status: Fetal Heart Rate (bpm): 180   Movement: Absent     Physical Exam could not be performed. Because of the  COVID-19 outbreak this visit was performed over the phone and not in person.   Assessment   38 y.o. [id number] at [redacted]w[redacted]d by  07/19/2019, by Last Menstrual Period presenting for routine prenatal visit  Plan     Gestational age appropriate obstetric precautions including but not limited to vaginal bleeding, contractions, leaking of fluid and fetal movement were reviewed in detail with the patient.    Normal dating [place] today. Given information on how to check insurance coverage for Maternit21 testing  Follow Up Instructions: Follow up as planned for 12/25/2018   I discussed the assessment and treatment plan with the patient. The patient was provided an opportunity to ask questions and all were answered. The patient agreed with the plan and demonstrated an understanding of the instructions.   The patient was advised to call back or seek an in-person evaluation if the symptoms worsen or if the condition fails to improve as anticipated.  I provided 10 minutes of non-face-to-face time during this encounter.  Return in about 2 weeks (around 12/27/2018) for ROB in person.  Adrian Prows MD [facility], [facility] 12/13/2018 2:46 PM

## 2018-12-13 NOTE — Progress Notes (Signed)
ROB/US

## 2018-12-13 NOTE — Patient Instructions (Signed)
 Hello,  Given the current COVID-19 pandemic, our practice is making changes in how we are providing care to our patients. We are limiting in-person visits for the safety of all of our patients.   As a practice, we have met to discuss the best way to minimize visits, but still provide excellent care to our expecting mothers.  We have decided on the following visit structure for low-risk pregnancies.  Initial Pregnancy visit will be conducted as a telephone or web visit.  Between 10-14 weeks  there will be one in-person visit for an ultrasound, lab work, and genetic screening. 20 weeks in-person visit with an anatomy ultrasound  28 weeks in-person office visit for a 1-hour glucose test and a TDAP vaccination 32 weeks in-person office visit 34 weeks telephone visit 36 weeks in-person office visit for GBS, chlamydia, and gonorrhea testing 38 weeks in-person office visit 40 weeks in-person office visit  Understandably, some patients will require more visits than what is outlined above. Additional visits will be determined on a case-by-case basis.   We will, as always, be available for emergencies or to address concerns that might arise between in-person visits. We ask that you allow us the opportunity to address any concerns over the phone or through a virtual visit first. We will be available to return your phone calls throughout the day.   If you are able to purchase a scale, a blood pressure machine, and a home fetal doppler visits could be limited further. This will help decrease your exposure risks, but these purchases are not a necessity.   Things seem to change daily and there is the possibility that this structure could change, please be patient as we adapt to a new way of caring for patients.   Thank you for trusting us with your prenatal care. Our practice values you and looks forward to providing you with excellent care.   Sincerely,   [facility], [facility]      COVID-19 and Your Pregnancy FAQ  How can I prevent infection with COVID-19 during my pregnancy? Social distancing is key. Please limit any interactions in public. Try and work from home if possible. Frequently wash your hands after touching possibly contaminated surfaces. Avoid touching your face.  Minimize trips to the store. Consider online ordering when possible.   Should I wear a mask? YES. It is recommended by the CDC that all people wear a cloth mask or facial covering in public. You should wear a mask to your visits in the office. This will help reduce transmission as well as your risk or acquiring COVID-19. New studies are showing that even asymptomatic individuals can spread the virus from talking.   Where can I get a mask?  and the city of [place] are partnering to provide masks to community members. You can pick up a mask from several locations. This website also has instructions about how to make a mask by sewing or without sewing by using a t-shirt or bandana.  https://www.[place].gov/i-want-to/learn-about/covid-19-information-and-updates/covid-19-face-mask-project  Studies have shown that if you were a tube or nylon stocking from pantyhose over a cloth mask it makes the cloth mask almost as effective as a N95 mask.  https://www.npr.org/sections/goatsandsoda/2018/10/24/840146830/adding-a-nylon-stocking-layer-could-boost-protection-from-cloth-masks-study-find  What are the symptoms of COVID-19? Fever (greater than 100.4 F), dry cough, shortness of breath.  Am I more at risk for COVID-19 since I am pregnant? There is not currently data showing that pregnant women are more adversely impacted by COVID-19 than the general population. However,   we know that pregnant women tend to have worse respiratory complications from similar diseases such as the flu and SARS and for this reason should be considered an at-risk population.  What do I do if I am  experiencing the symptoms of COVID-19? Testing is being limited because of test availability. If you are experiencing symptoms you should quarantine yourself, and the members of your family, for at least 2 weeks at home.   Please visit this website for more information: https://www.cdc.gov/coronavirus/2019-ncov/if-you-are-sick/steps-when-sick.html  When should I go to the Emergency Room? Please go to the emergency room if you are experiencing ANY of these symptoms*:  1.    Difficulty breathing or shortness of breath 2.    Persistent pain or pressure in the chest 3.    Confusion or difficulty being aroused (or awakened) 4.    Bluish lips or face  *This list is not all inclusive. Please consult our office for any other symptoms that are severe or concerning.  What do I do if I am having difficulty breathing? You should go to the Emergency Room for evaluation. At this time they have a tent set up for evaluating patients with COVID-19 symptoms.   How will my prenatal care be different because of the COVID-19 pandemic? It has been recommended to reduce the frequency of face-to-face visits and use resources such as telephone and virtual visits when possible. Using a scale, blood pressure machine and fetal doppler at home can further help reduce face-to-face visits. You will be provided with additional information on this topic.  We ask that you come to your visits alone to minimize potential exposures to  COVID-19.  How can I receive childbirth education? At this time in-person classes have been cancelled. You can register for online childbirth education, breastfeeding, and newborn care classes.  Please visit:  [email or web address] for more information  How will my hospital birth experience be different? The hospital is currently limiting visitors. This means that while you are in labor you can only have one person at the hospital with you. Additional family members will not be allowed  to wait in the building or outside your room. Your one support person can be the father of the baby, a relative, a doula, or a friend. Once one support person is designated that person will wear a band. This band cannot be shared with multiple people.  Nitrous Gas is not being offered for pain relief since the tubing and filter for the machine can not be sanitized in a way to guarantee prevention of transmission of COVID-19.  Nasal cannula use of oxygen for fetal indications has also been discontinued.  Currently a clear plastic sheet is being hung between mom and the delivering provider during pushing and delivery to help prevent transmission of COVID-19.      How long will I stay in the hospital for after giving birth? It is also recommended that discharge home be expedited during the COVID-19 outbreak. This means staying for 1 day after a vaginal delivery and 2 days after a cesarean section. Patients who need to stay longer for medical reasons are allowed to do so, but the goal will be for expedited discharge home.   What if I have COVID-19 and I am in labor? We ask that you wear a mask while on labor and delivery. We will try and accommodate you being placed in a room that is capable of filtering the air. Please call ahead if you are in labor and on your   way to the hospital. The phone number for labor and delivery at [facility] is [phone].  If I have COVID-19 when my baby is born how can I prevent my baby from contracting COVID-19? This is an issue that will have to be discussed on a case-by-case basis. Current recommendations suggest providing separate isolation rooms for both the mother and new infant as well as limiting visitors. However, there are practical challenges to this recommendation. The situation will assuredly change and decisions will be influenced by the desires of the mother and availability of space.  Some suggestions are the use of a curtain or  physical barrier between mom and infant, hand hygiene, mom wearing a mask, or 6 feet of spacing between a mom and infant.   Can I breastfeed during the COVID-19 pandemic?   Yes, breastfeeding is encouraged.  Can I breastfeed if I have COVID-19? Yes. Covid-19 has not been found in breast milk. This means you cannot give COVID-19 to your child through breast milk. Breast feeding will also help pass antibodies to fight infection to your baby.   What precautions should I take when breastfeeding if I have COVID-19? If a mother and newborn do room-in and the mother wishes to feed at the breast, she should put on a facemask and practice hand hygiene before each feeding.  What precautions should I take when pumping if I have COVID-19? Prior to expressing breast milk, mothers should practice hand hygiene. After each pumping session, all parts that come into contact with breast milk should be thoroughly washed and the entire pump should be appropriately disinfected per the manufacturer's instructions. This expressed breast milk should be fed to the newborn by a healthy caregiver.  What if I am pregnant and work in healthcare? Based on limited data regarding COVID-19 and pregnancy, ACOG currently does not propose creating additional restrictions on pregnant health care personnel because of COVID-19 alone. Pregnant women do not appear to be at higher risk of severe disease related to COVID-19. Pregnant health care personnel should follow CDC risk assessment and infection control guidelines for health care personnel exposed to patients with suspected or confirmed COVID-19. Adherence to recommended infection prevention and control practices is an important part of protecting all health care personnel in health care settings.    Information on COVID-19 in pregnancy is very limited; however, facilities may want to consider limiting exposure of pregnant health care personnel to patients with confirmed or suspected  COVID-19 infection, especially during higher-risk procedures (eg, aerosol-generating procedures), if feasible, based on staffing availability.    

## 2018-12-25 ENCOUNTER — Other Ambulatory Visit: Payer: Self-pay

## 2018-12-25 ENCOUNTER — Ambulatory Visit (INDEPENDENT_AMBULATORY_CARE_PROVIDER_SITE_OTHER): Payer: BC Managed Care – PPO | Admitting: Obstetrics and Gynecology

## 2018-12-25 ENCOUNTER — Other Ambulatory Visit: Payer: BC Managed Care – PPO

## 2018-12-25 VITALS — BP 120/80 | Wt 211.4 lb

## 2018-12-25 DIAGNOSIS — Z1379 Encounter for other screening for genetic and chromosomal anomalies: Secondary | ICD-10-CM

## 2018-12-25 DIAGNOSIS — Z3A10 10 weeks gestation of pregnancy: Secondary | ICD-10-CM

## 2018-12-25 DIAGNOSIS — Z3490 Encounter for supervision of normal pregnancy, unspecified, unspecified trimester: Secondary | ICD-10-CM

## 2018-12-25 DIAGNOSIS — O99210 Obesity complicating pregnancy, unspecified trimester: Secondary | ICD-10-CM | POA: Diagnosis not present

## 2018-12-25 DIAGNOSIS — Z131 Encounter for screening for diabetes mellitus: Secondary | ICD-10-CM | POA: Diagnosis not present

## 2018-12-25 DIAGNOSIS — Z31438 Encounter for other genetic testing of female for procreative management: Secondary | ICD-10-CM

## 2018-12-25 DIAGNOSIS — O09521 Supervision of elderly multigravida, first trimester: Secondary | ICD-10-CM

## 2018-12-25 LAB — POCT URINALYSIS DIPSTICK OB
Glucose, UA: NEGATIVE
POC,PROTEIN,UA: NEGATIVE

## 2018-12-25 LAB — OB RESULTS CONSOLE VARICELLA ZOSTER ANTIBODY, IGG: Varicella: IMMUNE

## 2018-12-25 NOTE — Progress Notes (Signed)
ROB/1 hr GTT- no concerns

## 2018-12-26 LAB — RPR+RH+ABO+RUB AB+AB SCR+CB...
Antibody Screen: NEGATIVE
HIV Screen 4th Generation wRfx: NONREACTIVE
Hematocrit: 37.6 % (ref 34.0–46.6)
Hemoglobin: 12.9 g/dL (ref 11.1–15.9)
Hepatitis B Surface Ag: NEGATIVE
MCH: 30.3 pg (ref 26.6–33.0)
MCHC: 34.3 g/dL (ref 31.5–35.7)
MCV: 88 fL (ref 79–97)
Platelets: 218 10*3/uL (ref 150–450)
RBC: 4.26 x10E6/uL (ref 3.77–5.28)
RDW: 12.4 % (ref 11.7–15.4)
RPR Ser Ql: NONREACTIVE
Rh Factor: POSITIVE
Rubella Antibodies, IGG: 1.02 index (ref >0.99)
Varicella zoster IgG: 898 index (ref >165)
WBC: 7.5 10*3/uL (ref 3.4–10.8)

## 2018-12-26 LAB — GLUCOSE, 1 HOUR GESTATIONAL: Gestational Diabetes Screen: 134 mg/dL (ref 65–139)

## 2018-12-26 NOTE — Progress Notes (Signed)
Routine Prenatal Care Visit  Subjective  Terry Henderson is a 38 y.o. [phone] at [redacted]w[redacted]d being seen today for ongoing prenatal care.  She is currently monitored for the following issues for this high-risk pregnancy and has History of 2 cesarean sections; Encounter for supervision of low-risk pregnancy, antepartum; AMA (advanced maternal age) multigravida 35+, first trimester; and Obesity affecting pregnancy, antepartum on their problem list.  ----------------------------------------------------------------------------------- Patient reports no complaints.   Contractions: Not present. Vag. Bleeding: None.  Movement: Absent. Denies leaking of fluid.  ----------------------------------------------------------------------------------- The following portions of the patient's history were reviewed and updated as appropriate: allergies, current medications, past family history, past medical history, past social history, past surgical history and problem list. Problem list updated.   Objective  Blood pressure 120/80, weight 211 lb 6.4 oz (95.9 kg), last menstrual period 10/12/2018. Pregravid weight 212 lb (96.2 kg) Total Weight Gain -9.6 oz (-0.272 kg) Urinalysis:      Fetal Status: Fetal Heart Rate (bpm): 179   Movement: Absent     General:  Alert, oriented and cooperative. Patient is in no acute distress.  Skin: Skin is warm and dry. No rash noted.   Cardiovascular: Normal heart rate noted  Respiratory: Normal respiratory effort, no problems with respiration noted  Abdomen: Soft, gravid, appropriate for gestational age. Pain/Pressure: Absent     Pelvic:  Cervical exam deferred        Extremities: Normal range of motion.     ental Status: Normal mood and affect. Normal behavior. Normal judgment and thought content.     Assessment   38 y.o. [id number] at [redacted]w[redacted]d by  07/19/2019, by Last Menstrual Period presenting for routine prenatal visit  Plan   pregnancy4 Problems (from 10/12/18 to present)     Problem Noted Resolved   Encounter for supervision of low-risk pregnancy, antepartum 11/20/2018 by Gae Dry, MD No   Overview Signed 11/20/2018  8:46 AM by Gae Dry, MD    [facility]inic [facility] Prenatal Labs  Dating LMP = 9 week [place] Blood type:     Genetic Screen 1 Screen:    AFP:     Quad:     NIPS: Antibody:   Anatomic [place]  Rubella:   Varicella: @VZVIGG @  GTT Early:               Third trimester:  RPR:     Rhogam  HBsAg:     TDaP vaccine                       Flu Shot: HIV:     Baby Food                                GBS:   Contraception  Pap: 08/17/2018 NIL HPV negative  CBB     CS/VBAC    Support Person            AMA (advanced maternal age) multigravida 75+, first trimester 11/20/2018 by Gae Dry, MD No   Obesity affecting pregnancy, antepartum 11/20/2018 by Gae Dry, MD No       Gestational age appropriate obstetric precautions including but not limited to vaginal bleeding, contractions, leaking of fluid and fetal movement were reviewed in detail with the patient.    Return in about 4 weeks (around 01/22/2019) for ROB.  Malachy Mood, MD, [facility], [facility] 12/26/2018, 9:24 PM

## 2019-01-22 ENCOUNTER — Ambulatory Visit (INDEPENDENT_AMBULATORY_CARE_PROVIDER_SITE_OTHER): Payer: BC Managed Care – PPO | Admitting: Advanced Practice Midwife

## 2019-01-22 ENCOUNTER — Other Ambulatory Visit: Payer: Self-pay

## 2019-01-22 ENCOUNTER — Encounter: Payer: Self-pay | Admitting: Advanced Practice Midwife

## 2019-01-22 VITALS — BP 118/76 | Wt 209.0 lb

## 2019-01-22 DIAGNOSIS — O09522 Supervision of elderly multigravida, second trimester: Secondary | ICD-10-CM

## 2019-01-22 DIAGNOSIS — Z3A14 14 weeks gestation of pregnancy: Secondary | ICD-10-CM

## 2019-01-22 DIAGNOSIS — O0990 Supervision of high risk pregnancy, unspecified, unspecified trimester: Secondary | ICD-10-CM

## 2019-01-22 NOTE — Progress Notes (Signed)
Routine Prenatal Care Visit  Subjective  Terry Henderson is a 38 y.o. [phone] at [redacted]w[redacted]d being seen today for ongoing prenatal care.  She is currently monitored for the following issues for this high-risk pregnancy and has History of 2 cesarean sections; Supervision of high-risk pregnancy; AMA (advanced maternal age) multigravida 38+, first trimester; and Obesity affecting pregnancy, antepartum on their problem list.  ----------------------------------------------------------------------------------- Patient reports no complaints.   Contractions: Not present. Vag. Bleeding: None.  Movement: Absent. Denies leaking of fluid.  ----------------------------------------------------------------------------------- The following portions of the patient's history were reviewed and updated as appropriate: allergies, current medications, past family history, past medical history, past social history, past surgical history and problem list. Problem list updated.   Objective  Blood pressure 118/76, weight 209 lb (94.8 kg), last menstrual period 10/12/2018. Pregravid weight 212 lb (96.2 kg) Total Weight Gain -3 lb (-1.361 kg) Urinalysis: Urine Protein    Urine Glucose    Fetal Status: Fetal Heart Rate (bpm): 154   Movement: Absent     General:  Alert, oriented and cooperative. Patient is in no acute distress.  Skin: Skin is warm and dry. No rash noted.   Cardiovascular: Normal heart rate noted  Respiratory: Normal respiratory effort, no problems with respiration noted  Abdomen: Soft, gravid, appropriate for gestational age. Pain/Pressure: Absent     Pelvic:  Cervical exam deferred        Extremities: Normal range of motion.     Mental Status: Normal mood and affect. Normal behavior. Normal judgment and thought content.   Assessment   38 y.o. [id number] at [redacted]w[redacted]d by  07/19/2019, by Last Menstrual Period presenting for routine prenatal visit  Plan   pregnancy4 Problems (from 10/12/18 to present)    Problem Noted Resolved   Supervision of high-risk pregnancy 11/20/2018 by Gae Dry, MD No   Overview Addendum 12/26/2018  9:23 PM by Malachy Mood, MD    [facility] Prenatal Labs  Dating LMP = 9 week [place] Blood type:     Genetic Screen 1 Screen:    AFP:     Quad:     NIPS: Antibody:   Anatomic [place]  Rubella:   Varicella: @VZVIGG @  GTT Early:               Third trimester:  RPR:     Rhogam  HBsAg:     TDaP vaccine                       Flu Shot: HIV:     Baby Food                                GBS:   Contraception  Pap: 08/17/2018 NIL HPV negative  CBB     CS/VBAC    Support Person            AMA (advanced maternal age) multigravida 23+, first trimester 11/20/2018 by Gae Dry, MD No   Obesity affecting pregnancy, antepartum 11/20/2018 by Gae Dry, MD No       Preterm labor symptoms and general obstetric precautions including but not limited to vaginal bleeding, contractions, leaking of fluid and fetal movement were reviewed in detail with the patient. Please refer to After Visit Summary for other counseling recommendations.   Return in about 4 weeks (around 02/19/2019) for anatomy scan and rob.  Rod Can, CNM 01/22/2019 8:36 AM

## 2019-01-22 NOTE — Patient Instructions (Signed)

## 2019-01-22 NOTE — Progress Notes (Signed)
No vb. No lof. Pt not as nauseas now and feeling better.

## 2019-02-19 ENCOUNTER — Ambulatory Visit (INDEPENDENT_AMBULATORY_CARE_PROVIDER_SITE_OTHER): Payer: BC Managed Care – PPO | Admitting: Maternal Newborn

## 2019-02-19 ENCOUNTER — Encounter: Payer: Self-pay | Admitting: Maternal Newborn

## 2019-02-19 ENCOUNTER — Other Ambulatory Visit: Payer: Self-pay

## 2019-02-19 ENCOUNTER — Ambulatory Visit (INDEPENDENT_AMBULATORY_CARE_PROVIDER_SITE_OTHER): Payer: BC Managed Care – PPO

## 2019-02-19 VITALS — BP 110/60 | Wt 212.0 lb

## 2019-02-19 DIAGNOSIS — Z363 Encounter for antenatal screening for malformations: Secondary | ICD-10-CM

## 2019-02-19 DIAGNOSIS — O0990 Supervision of high risk pregnancy, unspecified, unspecified trimester: Secondary | ICD-10-CM

## 2019-02-19 DIAGNOSIS — Z3A18 18 weeks gestation of pregnancy: Secondary | ICD-10-CM

## 2019-02-19 DIAGNOSIS — O09522 Supervision of elderly multigravida, second trimester: Secondary | ICD-10-CM

## 2019-02-19 LAB — POCT URINALYSIS DIPSTICK OB
Glucose, UA: NEGATIVE
POC,PROTEIN,UA: NEGATIVE

## 2019-02-19 NOTE — Progress Notes (Signed)
    Routine Prenatal Care Visit  Subjective  Terry Henderson is a 38 y.o. [phone] at [redacted]w[redacted]d being seen today for ongoing prenatal care.  She is currently monitored for the following issues for this high-risk pregnancy and has History of 2 cesarean sections; Supervision of high-risk pregnancy; AMA (advanced maternal age) multigravida 35+, first trimester; and Obesity affecting pregnancy, antepartum on their problem list.  ----------------------------------------------------------------------------------- Patient reports no complaints.   Contractions: Not present. Vag. Bleeding: None.  Movement: Present. No leaking of fluid.  ----------------------------------------------------------------------------------- The following portions of the patient's history were reviewed and updated as appropriate: allergies, current medications, past family history, past medical history, past social history, past surgical history and problem list. Problem list updated.  Objective  Blood pressure 110/60, weight 212 lb (96.2 kg), last menstrual period 10/12/2018. Pregravid weight 212 lb (96.2 kg) Total Weight Gain 0 lb (0 kg) Urinalysis: Urine dipstick shows negative for glucose, protein.  Fetal Status: Fetal Heart Rate (bpm): 150 ([place])   Movement: Present     General:  Alert, oriented and cooperative. Patient is in no acute distress.  Skin: Skin is warm and dry. No rash noted.   Cardiovascular: Normal heart rate noted  Respiratory: Normal respiratory effort, no problems with respiration noted  Abdomen: Soft, gravid, appropriate for gestational age. Pain/Pressure: Absent     Pelvic:  Cervical exam deferred        Extremities: Normal range of motion.     Mental Status: Normal mood and affect. Normal behavior. Normal judgment and thought content.     Assessment   38 y.o. [id number] at [redacted]w[redacted]d, EDD 07/19/2019 by Last Menstrual Period presenting for a routine prenatal visit.  Plan   pregnancy4 Problems (from  10/12/18 to present)    Problem Noted Resolved   Supervision of high-risk pregnancy 11/20/2018 by Gae Dry, MD No   Overview Addendum 12/26/2018  9:23 PM by Malachy Mood, MD    [facility] Prenatal Labs  Dating LMP = 9 week [place] Blood type:     Genetic Screen 1 Screen:    AFP:     Quad:     NIPS: Antibody:   Anatomic [place]  Rubella:   Varicella: @VZVIGG @  GTT Early:               Third trimester:  RPR:     Rhogam  HBsAg:     TDaP vaccine                       Flu Shot: HIV:     Baby Food                                GBS:   Contraception  Pap: 08/17/2018 NIL HPV negative  CBB     CS/VBAC    Support Person            AMA (advanced maternal age) multigravida 28+, first trimester 11/20/2018 by Gae Dry, MD No   Obesity affecting pregnancy, antepartum 11/20/2018 by Gae Dry, MD No    Anatomy scan completed today with normal anatomy, FHR 150 bpm, transverse presentation. Results reviewed with patient. Gender surprise.  Please refer to After Visit Summary for other counseling recommendations.   Return in about 4 weeks (around 03/19/2019) for [place] telephone.  Avel Sensor, CNM 02/19/2019  9:06 AM

## 2019-02-19 NOTE — Patient Instructions (Signed)

## 2019-03-19 ENCOUNTER — Encounter: Payer: Self-pay | Admitting: Obstetrics and Gynecology

## 2019-03-19 ENCOUNTER — Other Ambulatory Visit: Payer: Self-pay

## 2019-03-19 ENCOUNTER — Ambulatory Visit (INDEPENDENT_AMBULATORY_CARE_PROVIDER_SITE_OTHER): Payer: BC Managed Care – PPO | Admitting: Obstetrics and Gynecology

## 2019-03-19 VITALS — BP 116/74 | Wt 214.0 lb

## 2019-03-19 DIAGNOSIS — Z131 Encounter for screening for diabetes mellitus: Secondary | ICD-10-CM

## 2019-03-19 DIAGNOSIS — O0992 Supervision of high risk pregnancy, unspecified, second trimester: Secondary | ICD-10-CM

## 2019-03-19 DIAGNOSIS — O09522 Supervision of elderly multigravida, second trimester: Secondary | ICD-10-CM

## 2019-03-19 DIAGNOSIS — Z98891 History of uterine scar from previous surgery: Secondary | ICD-10-CM

## 2019-03-19 DIAGNOSIS — Z3A22 22 weeks gestation of pregnancy: Secondary | ICD-10-CM

## 2019-03-19 DIAGNOSIS — Z6836 Body mass index (BMI) 36.0-36.9, adult: Secondary | ICD-10-CM | POA: Insufficient documentation

## 2019-03-19 DIAGNOSIS — O99210 Obesity complicating pregnancy, unspecified trimester: Secondary | ICD-10-CM

## 2019-03-19 DIAGNOSIS — O34219 Maternal care for unspecified type scar from previous cesarean delivery: Secondary | ICD-10-CM

## 2019-03-19 DIAGNOSIS — Z113 Encounter for screening for infections with a predominantly sexual mode of transmission: Secondary | ICD-10-CM

## 2019-03-19 DIAGNOSIS — O99212 Obesity complicating pregnancy, second trimester: Secondary | ICD-10-CM

## 2019-03-19 NOTE — Progress Notes (Signed)
Routine Prenatal Care Visit  Subjective  Terry Henderson is a 38 y.o. [phone] at [redacted]w[redacted]d being seen today for ongoing prenatal care.  She is currently monitored for the following issues for this high-risk pregnancy and has History of 2 cesarean sections; Supervision of high-risk pregnancy; Advanced maternal age in multigravida; Obesity affecting pregnancy, antepartum; and BMI 36.0-36.9,adult on their problem list.  ----------------------------------------------------------------------------------- Patient reports no complaints.   Contractions: Not present. Vag. Bleeding: None.  Movement: Present. Leaking Fluid denies.  ----------------------------------------------------------------------------------- The following portions of the patient's history were reviewed and updated as appropriate: allergies, current medications, past family history, past medical history, past social history, past surgical history and problem list. Problem list updated.  Objective  Blood pressure 116/74, weight 214 lb (97.1 kg), last menstrual period 10/12/2018. Pregravid weight 212 lb (96.2 kg) Total Weight Gain 2 lb (0.907 kg) Urinalysis: Urine Protein    Urine Glucose    Fetal Status: Fetal Heart Rate (bpm): 140   Movement: Present     General:  Alert, oriented and cooperative. Patient is in no acute distress.  Skin: Skin is warm and dry. No rash noted.   Cardiovascular: Normal heart rate noted  Respiratory: Normal respiratory effort, no problems with respiration noted  Abdomen: Soft, gravid, appropriate for gestational age. Pain/Pressure: Absent     Pelvic:  Cervical exam deferred        Extremities: Normal range of motion.     Mental Status: Normal mood and affect. Normal behavior. Normal judgment and thought content.   Assessment   38 y.o. [id number] at [redacted]w[redacted]d by  07/19/2019, by Last Menstrual Period presenting for routine prenatal visit  Plan   pregnancy4 Problems (from 10/12/18 to present)    Problem Noted  Resolved   BMI 36.0-36.9,adult 03/19/2019 by Will Bonnet, MD No   History of 2 cesarean sections 11/20/2018 by Gae Dry, MD No   Supervision of high-risk pregnancy 11/20/2018 by Gae Dry, MD No   Overview Addendum 02/19/2019  9:02 AM by Rexene Agent, [facility] [place] Prenatal Labs  Dating LMP = 9 week [place] Blood type: AB/Positive/-- (06/23 1602)   Genetic Screen Declined Antibody:Negative (06/23 1602)  Anatomic [place] Complete 8/18 Rubella: 1.02 (06/23 1602) Varicella: Immune  GTT Early: 46               Third trimester:  RPR: Non Reactive (06/23 1602)   Rhogam  HBsAg: Negative (06/23 1602)   TDaP vaccine                       Flu Shot: HIV: Non Reactive (06/23 1602)   Baby Food                                GBS:   Contraception  Pap: 08/17/2018 NIL HPV negative  CBB     CS/VBAC    Support Person          Advanced maternal age in multigravida 11/20/2018 by Gae Dry, MD No   Obesity affecting pregnancy, antepartum 11/20/2018 by Gae Dry, MD No       Preterm labor symptoms and general obstetric precautions including but not limited to vaginal bleeding, contractions, leaking of fluid and fetal movement were reviewed in detail with the patient. Please refer to After Visit Summary for other counseling recommendations.   Return in about 4 weeks (around 04/16/2019) for  28 week labs and routine prenatal.  Prentice Docker, MD, [place], [facility] 03/19/2019 8:26 AM

## 2019-04-17 ENCOUNTER — Other Ambulatory Visit: Payer: BC Managed Care – PPO

## 2019-04-17 ENCOUNTER — Encounter: Payer: BC Managed Care – PPO | Admitting: Certified Nurse Midwife

## 2019-04-19 ENCOUNTER — Other Ambulatory Visit: Payer: Self-pay

## 2019-04-19 ENCOUNTER — Encounter: Payer: Self-pay | Admitting: Advanced Practice Midwife

## 2019-04-19 ENCOUNTER — Ambulatory Visit (INDEPENDENT_AMBULATORY_CARE_PROVIDER_SITE_OTHER): Payer: BC Managed Care – PPO | Admitting: Advanced Practice Midwife

## 2019-04-19 ENCOUNTER — Other Ambulatory Visit: Payer: BC Managed Care – PPO

## 2019-04-19 VITALS — BP 110/80 | Wt 218.0 lb

## 2019-04-19 DIAGNOSIS — Z3A27 27 weeks gestation of pregnancy: Secondary | ICD-10-CM

## 2019-04-19 DIAGNOSIS — O0992 Supervision of high risk pregnancy, unspecified, second trimester: Secondary | ICD-10-CM | POA: Diagnosis not present

## 2019-04-19 DIAGNOSIS — O0990 Supervision of high risk pregnancy, unspecified, unspecified trimester: Secondary | ICD-10-CM

## 2019-04-19 DIAGNOSIS — Z23 Encounter for immunization: Secondary | ICD-10-CM | POA: Diagnosis not present

## 2019-04-19 DIAGNOSIS — Z131 Encounter for screening for diabetes mellitus: Secondary | ICD-10-CM

## 2019-04-19 DIAGNOSIS — O09522 Supervision of elderly multigravida, second trimester: Secondary | ICD-10-CM

## 2019-04-19 DIAGNOSIS — Z113 Encounter for screening for infections with a predominantly sexual mode of transmission: Secondary | ICD-10-CM | POA: Diagnosis not present

## 2019-04-19 LAB — POCT URINALYSIS DIPSTICK OB
Glucose, UA: NEGATIVE
POC,PROTEIN,UA: NEGATIVE

## 2019-04-19 NOTE — Progress Notes (Signed)
Routine Prenatal Care Visit  Subjective  Terry Henderson is a 38 y.o. [phone] at [redacted]w[redacted]d being seen today for ongoing prenatal care.  She is currently monitored for the following issues for this high-risk pregnancy and has History of 2 cesarean sections; Supervision of high-risk pregnancy; Advanced maternal age in multigravida; Obesity affecting pregnancy, antepartum; and BMI 36.0-36.9,adult on their problem list.  ----------------------------------------------------------------------------------- Patient reports no complaints.  We discussed scheduling repeat c/s for 39 weeks with SDJ or AMS. Contractions: Not present. Vag. Bleeding: None.  Movement: Present. Leaking Fluid denies.  ----------------------------------------------------------------------------------- The following portions of the patient's history were reviewed and updated as appropriate: allergies, current medications, past family history, past medical history, past social history, past surgical history and problem list. Problem list updated.  Objective  Blood pressure 110/80, weight 218 lb (98.9 kg), last menstrual period 10/12/2018. Pregravid weight 212 lb (96.2 kg) Total Weight Gain 6 lb (2.722 kg) Urinalysis: Urine Protein Negative  Urine Glucose Negative  Fetal Status: Fetal Heart Rate (bpm): 144 Fundal Height: 29 cm Movement: Present     General:  Alert, oriented and cooperative. Patient is in no acute distress.  Skin: Skin is warm and dry. No rash noted.   Cardiovascular: Normal heart rate noted  Respiratory: Normal respiratory effort, no problems with respiration noted  Abdomen: Soft, gravid, appropriate for gestational age. Pain/Pressure: Absent     Pelvic:  Cervical exam deferred        Extremities: Normal range of motion.  Edema: Trace  Mental Status: Normal mood and affect. Normal behavior. Normal judgment and thought content.   Assessment   38 y.o. [id number] at [redacted]w[redacted]d by  07/19/2019, by Last Menstrual Period  presenting for routine prenatal visit  Plan   pregnancy4 Problems (from 10/12/18 to present)    Problem Noted Resolved   BMI 36.0-36.9,adult 03/19/2019 by Will Bonnet, MD No   History of 2 cesarean sections 11/20/2018 by Gae Dry, MD No   Supervision of high-risk pregnancy 11/20/2018 by Gae Dry, MD No   Overview Addendum 02/19/2019  9:02 AM by Rexene Agent, [place] Prenatal Labs  Dating LMP = 9 week [place] Blood type: AB/Positive/-- (06/23 1602)   Genetic Screen Declined Antibody:Negative (06/23 1602)  Anatomic [place] Complete 8/18 Rubella: 1.02 (06/23 1602) Varicella: Immune  GTT Early: 46               Third trimester:  RPR: Non Reactive (06/23 1602)   Rhogam  HBsAg: Negative (06/23 1602)   TDaP vaccine                       Flu Shot: HIV: Non Reactive (06/23 1602)   Baby Food                                GBS:   Contraception  Pap: 08/17/2018 NIL HPV negative  CBB     CS/VBAC    Support Person            Advanced maternal age in multigravida 11/20/2018 by Gae Dry, MD No   Obesity affecting pregnancy, antepartum 11/20/2018 by Gae Dry, MD No       Preterm labor symptoms and general obstetric precautions including but not limited to vaginal bleeding, contractions, leaking of fluid and fetal movement were reviewed in detail with the patient. Please refer to After Visit Summary for  other counseling recommendations.   Return in about 2 weeks (around 05/03/2019) for growth and rob.  Rod Can, CNM 04/19/2019 9:46 AM

## 2019-04-19 NOTE — Progress Notes (Signed)
ROB/1 hr GTT/flu shot today- no concerns

## 2019-04-19 NOTE — Patient Instructions (Signed)
Third Trimester of Pregnancy The third trimester is from week 28 through week 40 (months 7 through 9). The third trimester is a time when the unborn baby (fetus) is growing rapidly. At the end of the ninth month, the fetus is about 20 inches in length and weighs 6-10 pounds. Body changes during your third trimester Your body will continue to go through many changes during pregnancy. The changes vary from woman to woman. During the third trimester:  Your weight will continue to increase. You can expect to gain 25-35 pounds (11-16 kg) by the end of the pregnancy.  You may begin to get stretch marks on your hips, abdomen, and breasts.  You may urinate more often because the fetus is moving lower into your pelvis and pressing on your bladder.  You may develop or continue to have heartburn. This is caused by increased hormones that slow down muscles in the digestive tract.  You may develop or continue to have constipation because increased hormones slow digestion and cause the muscles that push waste through your intestines to relax.  You may develop hemorrhoids. These are swollen veins (varicose veins) in the rectum that can itch or be painful.  You may develop swollen, bulging veins (varicose veins) in your legs.  You may have increased body aches in the pelvis, back, or thighs. This is due to weight gain and increased hormones that are relaxing your joints.  You may have changes in your hair. These can include thickening of your hair, rapid growth, and changes in texture. Some women also have hair loss during or after pregnancy, or hair that feels dry or thin. Your hair will most likely return to normal after your baby is born.  Your breasts will continue to grow and they will continue to become tender. A yellow fluid (colostrum) may leak from your breasts. This is the first milk you are producing for your baby.  Your belly button may stick out.  You may notice more swelling in your hands,  face, or ankles.  You may have increased tingling or numbness in your hands, arms, and legs. The skin on your belly may also feel numb.  You may feel short of breath because of your expanding uterus.  You may have more problems sleeping. This can be caused by the size of your belly, increased need to urinate, and an increase in your body's metabolism.  You may notice the fetus "dropping," or moving lower in your abdomen (lightening).  You may have increased vaginal discharge.  You may notice your joints feel loose and you may have pain around your pelvic bone. What to expect at prenatal visits You will have prenatal exams every 2 weeks until week 36. Then you will have weekly prenatal exams. During a routine prenatal visit:  You will be weighed to make sure you and the baby are growing normally.  Your blood pressure will be taken.  Your abdomen will be measured to track your baby's growth.  The fetal heartbeat will be listened to.  Any test results from the previous visit will be discussed.  You may have a cervical check near your due date to see if your cervix has softened or thinned (effaced).  You will be tested for Group B streptococcus. This happens between 35 and 37 weeks. Your health care provider may ask you:  What your birth plan is.  How you are feeling.  If you are feeling the baby move.  If you have had any abnormal   symptoms, such as leaking fluid, bleeding, severe headaches, or abdominal cramping.  If you are using any tobacco products, including cigarettes, chewing tobacco, and electronic cigarettes.  If you have any questions. Other tests or screenings that may be performed during your third trimester include:  Blood tests that check for low iron levels (anemia).  Fetal testing to check the health, activity level, and growth of the fetus. Testing is done if you have certain medical conditions or if there are problems during the pregnancy.  Nonstress test  (NST). This test checks the health of your baby to make sure there are no signs of problems, such as the baby not getting enough oxygen. During this test, a belt is placed around your belly. The baby is made to move, and its heart rate is monitored during movement. What is false labor? False labor is a condition in which you feel small, irregular tightenings of the muscles in the womb (contractions) that usually go away with rest, changing position, or drinking water. These are called Braxton Hicks contractions. Contractions may last for hours, days, or even weeks before true labor sets in. If contractions come at regular intervals, become more frequent, increase in intensity, or become painful, you should see your health care provider. What are the signs of labor?  Abdominal cramps.  Regular contractions that start at 10 minutes apart and become stronger and more frequent with time.  Contractions that start on the top of the uterus and spread down to the lower abdomen and back.  Increased pelvic pressure and dull back pain.  A watery or bloody mucus discharge that comes from the vagina.  Leaking of amniotic fluid. This is also known as your "water breaking." It could be a slow trickle or a gush. Let your health care provider know if it has a color or strange odor. If you have any of these signs, call your health care provider right away, even if it is before your due date. Follow these instructions at home: Medicines  Follow your health care provider's instructions regarding medicine use. Specific medicines may be either safe or unsafe to take during pregnancy.  Take a prenatal vitamin that contains at least 600 micrograms (mcg) of folic acid.  If you develop constipation, try taking a stool softener if your health care provider approves. Eating and drinking   Eat a balanced diet that includes fresh fruits and vegetables, whole grains, good sources of protein such as meat, eggs, or tofu,  and low-fat dairy. Your health care provider will help you determine the amount of weight gain that is right for you.  Avoid raw meat and uncooked cheese. These carry germs that can cause birth defects in the baby.  If you have low calcium intake from food, talk to your health care provider about whether you should take a daily calcium supplement.  Eat four or five small meals rather than three large meals a day.  Limit foods that are high in fat and processed sugars, such as fried and sweet foods.  To prevent constipation: ? Drink enough fluid to keep your urine clear or pale yellow. ? Eat foods that are high in fiber, such as fresh fruits and vegetables, whole grains, and beans. Activity  Exercise only as directed by your health care provider. Most women can continue their usual exercise routine during pregnancy. Try to exercise for 30 minutes at least 5 days a week. Stop exercising if you experience uterine contractions.  Avoid heavy lifting.  Do   not exercise in extreme heat or humidity, or at high altitudes.  Wear low-heel, comfortable shoes.  Practice good posture.  You may continue to have sex unless your health care provider tells you otherwise. Relieving pain and discomfort  Take frequent breaks and rest with your legs elevated if you have leg cramps or low back pain.  Take warm sitz baths to soothe any pain or discomfort caused by hemorrhoids. Use hemorrhoid cream if your health care provider approves.  Wear a good support bra to prevent discomfort from breast tenderness.  If you develop varicose veins: ? Wear support pantyhose or compression stockings as told by your healthcare provider. ? Elevate your feet for 15 minutes, 3-4 times a day. Prenatal care  Write down your questions. Take them to your prenatal visits.  Keep all your prenatal visits as told by your health care provider. This is important. Safety  Wear your seat belt at all times when driving.  Make  a list of emergency phone numbers, including numbers for family, friends, the hospital, and police and fire departments. General instructions  Avoid cat litter boxes and soil used by cats. These carry germs that can cause birth defects in the baby. If you have a cat, ask someone to clean the litter box for you.  Do not travel far distances unless it is absolutely necessary and only with the approval of your health care provider.  Do not use hot tubs, steam rooms, or saunas.  Do not drink alcohol.  Do not use any products that contain nicotine or tobacco, such as cigarettes and e-cigarettes. If you need help quitting, ask your health care provider.  Do not use any medicinal herbs or unprescribed drugs. These chemicals affect the formation and growth of the baby.  Do not douche or use tampons or scented sanitary pads.  Do not cross your legs for long periods of time.  To prepare for the arrival of your baby: ? Take prenatal classes to understand, practice, and ask questions about labor and delivery. ? Make a trial run to the hospital. ? Visit the hospital and tour the maternity area. ? Arrange for maternity or paternity leave through employers. ? Arrange for family and friends to take care of pets while you are in the hospital. ? Purchase a rear-facing car seat and make sure you know how to install it in your car. ? Pack your hospital bag. ? Prepare the baby's nursery. Make sure to remove all pillows and stuffed animals from the baby's crib to prevent suffocation.  Visit your dentist if you have not gone during your pregnancy. Use a soft toothbrush to brush your teeth and be gentle when you floss. Contact a health care provider if:  You are unsure if you are in labor or if your water has broken.  You become dizzy.  You have mild pelvic cramps, pelvic pressure, or nagging pain in your abdominal area.  You have lower back pain.  You have persistent nausea, vomiting, or diarrhea.   You have an unusual or bad smelling vaginal discharge.  You have pain when you urinate. Get help right away if:  Your water breaks before 37 weeks.  You have regular contractions less than 5 minutes apart before 37 weeks.  You have a fever.  You are leaking fluid from your vagina.  You have spotting or bleeding from your vagina.  You have severe abdominal pain or cramping.  You have rapid weight loss or weight gain.  You have  shortness of breath with chest pain.  You notice sudden or extreme swelling of your face, hands, ankles, feet, or legs.  Your baby makes fewer than 10 movements in 2 hours.  You have severe headaches that do not go away when you take medicine.  You have vision changes. Summary  The third trimester is from week 28 through week 40, months 7 through 9. The third trimester is a time when the unborn baby (fetus) is growing rapidly.  During the third trimester, your discomfort may increase as you and your baby continue to gain weight. You may have abdominal, leg, and back pain, sleeping problems, and an increased need to urinate.  During the third trimester your breasts will keep growing and they will continue to become tender. A yellow fluid (colostrum) may leak from your breasts. This is the first milk you are producing for your baby.  False labor is a condition in which you feel small, irregular tightenings of the muscles in the womb (contractions) that eventually go away. These are called Braxton Hicks contractions. Contractions may last for hours, days, or even weeks before true labor sets in.  Signs of labor can include: abdominal cramps; regular contractions that start at 10 minutes apart and become stronger and more frequent with time; watery or bloody mucus discharge that comes from the vagina; increased pelvic pressure and dull back pain; and leaking of amniotic fluid. This information is not intended to replace advice given to you by your health  care provider. Make sure you discuss any questions you have with your health care provider. Document Released: 06/14/2001 Document Revised: 10/11/2018 Document Reviewed: 07/26/2016 Elsevier Patient Education  2020 [employer]  

## 2019-04-20 LAB — 28 WEEK RH+PANEL
Basophils Absolute: 0 10*3/uL (ref 0.0–0.2)
Basos: 0 %
EOS (ABSOLUTE): 0 10*3/uL (ref 0.0–0.4)
Eos: 0 %
Gestational Diabetes Screen: 128 mg/dL (ref 65–139)
HIV Screen 4th Generation wRfx: NONREACTIVE
Hematocrit: 38 % (ref 34.0–46.6)
Hemoglobin: 12.2 g/dL (ref 11.1–15.9)
Immature Grans (Abs): 0 10*3/uL (ref 0.0–0.1)
Immature Granulocytes: 1 %
Lymphocytes Absolute: 1.1 10*3/uL (ref 0.7–3.1)
Lymphs: 14 %
MCH: 29.5 pg (ref 26.6–33.0)
MCHC: 32.1 g/dL (ref 31.5–35.7)
MCV: 92 fL (ref 79–97)
Monocytes Absolute: 0.3 10*3/uL (ref 0.1–0.9)
Monocytes: 4 %
Neutrophils Absolute: 6 10*3/uL (ref 1.4–7.0)
Neutrophils: 81 %
Platelets: 198 10*3/uL (ref 150–450)
RBC: 4.13 x10E6/uL (ref 3.77–5.28)
RDW: 12.7 % (ref 11.7–15.4)
RPR Ser Ql: NONREACTIVE
WBC: 7.4 10*3/uL (ref 3.4–10.8)

## 2019-04-30 ENCOUNTER — Telehealth: Payer: Self-pay | Admitting: Obstetrics and Gynecology

## 2019-04-30 NOTE — Telephone Encounter (Signed)
-----   Message from Rod Can, CNM sent at 04/19/2019  2:22 PM EDT ----- Surgery Booking Request Patient Full Name:  Terry Henderson  MRN: [id number]  DOB: 1980/10/04  Surgeon: Earl Lagos Requested Surgery Date and Time: 07/15/2018 Primary Diagnosis AND Code: repeat c/section Secondary Diagnosis and Code:  Surgical Procedure: Cesarean Section L&D Notification: Yes Admission Status: surgery admit Length of Surgery: 1m Special Case Needs: Yes On Q pump H&P:  Phone Interview???:   Interpreter: No Language:  Medical Clearance:   Special Scheduling Instructions: please schedule as 1st case of the day with SDJ so he can get back to clinic (has been ok'd with RPH Any known health/anesthesia issues, diabetes, sleep apnea, latex allergy, defibrillator/pacemaker?:  Acuity: P1 or P2   (P1 highest, P2 delay may cause harm, P3 low, elective gyn, P4 lowest)

## 2019-04-30 NOTE — Telephone Encounter (Signed)
Lmtrc

## 2019-05-03 ENCOUNTER — Encounter: Payer: BC Managed Care – PPO | Admitting: Certified Nurse Midwife

## 2019-05-03 ENCOUNTER — Other Ambulatory Visit: Payer: Self-pay

## 2019-05-03 ENCOUNTER — Ambulatory Visit (INDEPENDENT_AMBULATORY_CARE_PROVIDER_SITE_OTHER): Payer: BC Managed Care – PPO | Admitting: Certified Nurse Midwife

## 2019-05-03 ENCOUNTER — Ambulatory Visit (INDEPENDENT_AMBULATORY_CARE_PROVIDER_SITE_OTHER): Payer: BC Managed Care – PPO

## 2019-05-03 VITALS — BP 110/70 | Wt 219.0 lb

## 2019-05-03 DIAGNOSIS — Z362 Encounter for other antenatal screening follow-up: Secondary | ICD-10-CM | POA: Diagnosis not present

## 2019-05-03 DIAGNOSIS — Z3A29 29 weeks gestation of pregnancy: Secondary | ICD-10-CM

## 2019-05-03 DIAGNOSIS — O34219 Maternal care for unspecified type scar from previous cesarean delivery: Secondary | ICD-10-CM

## 2019-05-03 DIAGNOSIS — O0993 Supervision of high risk pregnancy, unspecified, third trimester: Secondary | ICD-10-CM

## 2019-05-03 DIAGNOSIS — O0990 Supervision of high risk pregnancy, unspecified, unspecified trimester: Secondary | ICD-10-CM

## 2019-05-03 DIAGNOSIS — Z98891 History of uterine scar from previous surgery: Secondary | ICD-10-CM

## 2019-05-03 LAB — POCT URINALYSIS DIPSTICK OB: Glucose, UA: NEGATIVE

## 2019-05-03 NOTE — Progress Notes (Signed)
C/o needs breast pump rx for ins; scheduling c/s.rj

## 2019-05-04 NOTE — Progress Notes (Addendum)
ROB at 29 weeks: Good fetal movement. Plans on breast feeding. [facility] pamphlet to order breast pump as well as Ready, Set, Baby information. Growth scan today: CGA 30wk1d with EFW 0000000 ([id number] cephalic/ AFI [id number] Prior CS x 2, desires repeat CS.  28 week labs WNL. ROB in 2 weeks Will call with CS date  Dalia Heading, CNM  Addendum: CS has been scheduled for 07/16/2019 with Dr Glennon Mac. Left message regarding surgery date and need for Denyse Dago to talk with her.  Dalia Heading, CNM

## 2019-05-06 NOTE — Telephone Encounter (Signed)
Lmtrc

## 2019-05-08 NOTE — Telephone Encounter (Signed)
Patient is aware of H&P at [facility] on 07/08/19 @ 8:10am, Pre-admit testing to be scheduled, COVID testing on 07/12/19, and OR on 07/16/19. Patient is aware to quarantine after COVID testing.

## 2019-05-17 ENCOUNTER — Other Ambulatory Visit: Payer: Self-pay

## 2019-05-17 ENCOUNTER — Ambulatory Visit (INDEPENDENT_AMBULATORY_CARE_PROVIDER_SITE_OTHER): Payer: BC Managed Care – PPO | Admitting: Certified Nurse Midwife

## 2019-05-17 VITALS — BP 110/66 | Wt 223.0 lb

## 2019-05-17 DIAGNOSIS — Z23 Encounter for immunization: Secondary | ICD-10-CM | POA: Diagnosis not present

## 2019-05-17 DIAGNOSIS — O0990 Supervision of high risk pregnancy, unspecified, unspecified trimester: Secondary | ICD-10-CM

## 2019-05-17 DIAGNOSIS — Z3A31 31 weeks gestation of pregnancy: Secondary | ICD-10-CM

## 2019-05-17 DIAGNOSIS — O34219 Maternal care for unspecified type scar from previous cesarean delivery: Secondary | ICD-10-CM

## 2019-05-17 LAB — POCT URINALYSIS DIPSTICK OB: Glucose, UA: NEGATIVE

## 2019-05-17 NOTE — Progress Notes (Signed)
ROB at 31 weeks: Baby active. Denies problems. Has a little ankle edema. Scheduled for repeat Cesarean section on 1/12 with Dr Glennon Mac Desires to breast feed. Contraception: Mirena IUD TDAP today Signed blood transfusion consent ROB in 2 weeks Dalia Heading, CNM

## 2019-05-17 NOTE — Addendum Note (Signed)
Addended by: Cleophas Dunker D on: 05/17/2019 08:55 AM   Modules accepted: Orders

## 2019-05-17 NOTE — Progress Notes (Signed)
No concerns.rj 

## 2019-06-04 ENCOUNTER — Ambulatory Visit (INDEPENDENT_AMBULATORY_CARE_PROVIDER_SITE_OTHER): Payer: BC Managed Care – PPO | Admitting: Obstetrics and Gynecology

## 2019-06-04 ENCOUNTER — Other Ambulatory Visit: Payer: Self-pay

## 2019-06-04 ENCOUNTER — Encounter: Payer: Self-pay | Admitting: Obstetrics and Gynecology

## 2019-06-04 VITALS — BP 118/74 | Wt 227.0 lb

## 2019-06-04 DIAGNOSIS — O99210 Obesity complicating pregnancy, unspecified trimester: Secondary | ICD-10-CM

## 2019-06-04 DIAGNOSIS — Z3A33 33 weeks gestation of pregnancy: Secondary | ICD-10-CM

## 2019-06-04 DIAGNOSIS — O34219 Maternal care for unspecified type scar from previous cesarean delivery: Secondary | ICD-10-CM

## 2019-06-04 DIAGNOSIS — O99213 Obesity complicating pregnancy, third trimester: Secondary | ICD-10-CM

## 2019-06-04 DIAGNOSIS — Z98891 History of uterine scar from previous surgery: Secondary | ICD-10-CM

## 2019-06-04 DIAGNOSIS — O09523 Supervision of elderly multigravida, third trimester: Secondary | ICD-10-CM

## 2019-06-04 DIAGNOSIS — O0993 Supervision of high risk pregnancy, unspecified, third trimester: Secondary | ICD-10-CM

## 2019-06-04 DIAGNOSIS — Z6836 Body mass index (BMI) 36.0-36.9, adult: Secondary | ICD-10-CM

## 2019-06-04 NOTE — Progress Notes (Signed)
Routine Prenatal Care Visit  Subjective  Terry Henderson is a 38 y.o. [phone] at [redacted]w[redacted]d being seen today for ongoing prenatal care.  She is currently monitored for the following issues for this high-risk pregnancy and has History of 2 cesarean sections; Supervision of high-risk pregnancy; Advanced maternal age in multigravida; Obesity affecting pregnancy, antepartum; and BMI 36.0-36.9,adult on their problem list.  ----------------------------------------------------------------------------------- Patient reports numbness in 2nd and 3rd digits since TDaP shot. The numbness is dorsal and palmar. No alleviating or aggravating factors. No weakness.   Contractions: Not present. Vag. Bleeding: None.  Movement: Present. Leaking Fluid denies.  ----------------------------------------------------------------------------------- The following portions of the patient's history were reviewed and updated as appropriate: allergies, current medications, past family history, past medical history, past social history, past surgical history and problem list. Problem list updated.  Objective  Blood pressure 118/74, weight 227 lb (103 kg), last menstrual period 10/12/2018. Pregravid weight 212 lb (96.2 kg) Total Weight Gain 15 lb (6.804 kg) Urinalysis: Urine Protein    Urine Glucose    Fetal Status: Fetal Heart Rate (bpm): 125 Fundal Height: 34 cm Movement: Present     General:  Alert, oriented and cooperative. Patient is in no acute distress.  Skin: Skin is warm and dry. No rash noted.   Cardiovascular: Normal heart rate noted  Respiratory: Normal respiratory effort, no problems with respiration noted  Abdomen: Soft, gravid, appropriate for gestational age. Pain/Pressure: Absent     Pelvic:  Cervical exam deferred        Extremities: Normal range of motion.     Mental Status: Normal mood and affect. Normal behavior. Normal judgment and thought content.   Assessment   38 y.o. [id number] at [redacted]w[redacted]d by  07/19/2019,  by Last Menstrual Period presenting for routine prenatal visit  Plan   pregnancy4 Problems (from 10/12/18 to present)    Problem Noted Resolved   BMI 36.0-36.9,adult 03/19/2019 by Will Bonnet, MD No   History of 2 cesarean sections 11/20/2018 by Gae Dry, MD No   Supervision of high-risk pregnancy 11/20/2018 by Gae Dry, MD No   Overview Addendum 05/17/2019  8:32 AM by Dalia Heading, [place] Prenatal Labs  Dating LMP = 9 week [place] Blood type: AB/Positive/-- (06/23 1602)   Genetic Screen Declined Antibody:Negative (06/23 1602)  Anatomic [place] Complete 8/18 Rubella: 1.02 (06/23 1602) Varicella: Immune  GTT Early: 19               Third trimester: 128 RPR: Non Reactive (06/23 1602)   Rhogam  HBsAg: Negative (06/23 1602)   TDaP vaccine    11/13    Flu Shot: 10/16 HIV: Non Reactive (06/23 1602)   Baby Food      Breast                          GBS:   Contraception Mirena Pap: 08/17/2018 NIL HPV negative  CBB     CS/VBAC    Support Person            Advanced maternal age in multigravida 11/20/2018 by Gae Dry, MD No   Obesity affecting pregnancy, antepartum 11/20/2018 by Gae Dry, MD No       Preterm labor symptoms and general obstetric precautions including but not limited to vaginal bleeding, contractions, leaking of fluid and fetal movement were reviewed in detail with the patient. Please refer to After Visit Summary for other counseling recommendations.  Return in about 2 weeks (around 06/18/2019) for Routine Prenatal Appointment.  Prentice Docker, MD, [facility], [facility] 06/04/2019 8:29 AM

## 2019-06-21 ENCOUNTER — Other Ambulatory Visit: Payer: Self-pay

## 2019-06-21 ENCOUNTER — Ambulatory Visit (INDEPENDENT_AMBULATORY_CARE_PROVIDER_SITE_OTHER): Payer: BC Managed Care – PPO | Admitting: Obstetrics and Gynecology

## 2019-06-21 ENCOUNTER — Encounter: Payer: Self-pay | Admitting: Obstetrics and Gynecology

## 2019-06-21 ENCOUNTER — Other Ambulatory Visit (HOSPITAL_COMMUNITY)
Admission: RE | Admit: 2019-06-21 | Discharge: 2019-06-21 | Disposition: A | Discharge status: Home / Self Care | Payer: BC Managed Care – PPO | Source: Ambulatory Visit | Attending: Obstetrics and Gynecology | Admitting: Obstetrics and Gynecology

## 2019-06-21 VITALS — BP 120/74 | Wt 234.0 lb

## 2019-06-21 DIAGNOSIS — O0993 Supervision of high risk pregnancy, unspecified, third trimester: Secondary | ICD-10-CM | POA: Insufficient documentation

## 2019-06-21 DIAGNOSIS — O99213 Obesity complicating pregnancy, third trimester: Secondary | ICD-10-CM

## 2019-06-21 DIAGNOSIS — O99210 Obesity complicating pregnancy, unspecified trimester: Secondary | ICD-10-CM

## 2019-06-21 DIAGNOSIS — Z3A36 36 weeks gestation of pregnancy: Secondary | ICD-10-CM | POA: Insufficient documentation

## 2019-06-21 DIAGNOSIS — Z0289 Encounter for other administrative examinations: Secondary | ICD-10-CM

## 2019-06-21 DIAGNOSIS — Z6836 Body mass index (BMI) 36.0-36.9, adult: Secondary | ICD-10-CM

## 2019-06-21 DIAGNOSIS — O34219 Maternal care for unspecified type scar from previous cesarean delivery: Secondary | ICD-10-CM

## 2019-06-21 DIAGNOSIS — Z98891 History of uterine scar from previous surgery: Secondary | ICD-10-CM

## 2019-06-21 DIAGNOSIS — O09523 Supervision of elderly multigravida, third trimester: Secondary | ICD-10-CM

## 2019-06-21 LAB — OB RESULTS CONSOLE GC/CHLAMYDIA: Gonorrhea: NEGATIVE

## 2019-06-21 NOTE — Progress Notes (Signed)
Routine Prenatal Care Visit  Subjective  Terry Henderson is a 38 y.o. [phone] at [redacted]w[redacted]d being seen today for ongoing prenatal care.  She is currently monitored for the following issues for this high-risk pregnancy and has History of 2 cesarean sections; Supervision of high-risk pregnancy; Advanced maternal age in multigravida; Obesity affecting pregnancy, antepartum; and BMI 36.0-36.9,adult on their problem list.  ----------------------------------------------------------------------------------- Patient reports no complaints.   Contractions: Not present. Vag. Bleeding: None.  Movement: Present. Leaking Fluid denies.  ----------------------------------------------------------------------------------- The following portions of the patient's history were reviewed and updated as appropriate: allergies, current medications, past family history, past medical history, past social history, past surgical history and problem list. Problem list updated.  Objective  Blood pressure 120/74, weight 234 lb (106.1 kg), last menstrual period 10/12/2018. Pregravid weight 212 lb (96.2 kg) Total Weight Gain 22 lb (9.979 kg) Urinalysis: Urine Protein    Urine Glucose    Fetal Status: Fetal Heart Rate (bpm): 145 Fundal Height: 36 cm Movement: Present  Presentation: Complete Breech  General:  Alert, oriented and cooperative. Patient is in no acute distress.  Skin: Skin is warm and dry. No rash noted.   Cardiovascular: Normal heart rate noted  Respiratory: Normal respiratory effort, no problems with respiration noted  Abdomen: Soft, gravid, appropriate for gestational age. Pain/Pressure: Present     Pelvic:  Cervical exam deferred        Extremities: Normal range of motion.  Edema: Trace  Mental Status: Normal mood and affect. Normal behavior. Normal judgment and thought content.   Assessment   38 y.o. [id number] at [redacted]w[redacted]d by  07/19/2019, by Last Menstrual Period presenting for routine prenatal visit  Plan    pregnancy4 Problems (from 10/12/18 to present)    Problem Noted Resolved   BMI 36.0-36.9,adult 03/19/2019 by Will Bonnet, MD No   History of 2 cesarean sections 11/20/2018 by Gae Dry, MD No   Supervision of high-risk pregnancy 11/20/2018 by Gae Dry, MD No   Overview Addendum 05/17/2019  8:32 AM by Dalia Heading, [place] Prenatal Labs  Dating LMP = 9 week [place] Blood type: AB/Positive/-- (06/23 1602)   Genetic Screen Declined Antibody:Negative (06/23 1602)  Anatomic [place] Complete 8/18 Rubella: 1.02 (06/23 1602) Varicella: Immune  GTT Early: 21               Third trimester: 128 RPR: Non Reactive (06/23 1602)   Rhogam  HBsAg: Negative (06/23 1602)   TDaP vaccine    11/13    Flu Shot: 10/16 HIV: Non Reactive (06/23 1602)   Baby Food      Breast                          GBS:   Contraception Mirena Pap: 08/17/2018 NIL HPV negative  CBB     CS/VBAC    Support Person            Advanced maternal age in multigravida 11/20/2018 by Gae Dry, MD No   Obesity affecting pregnancy, antepartum 11/20/2018 by Gae Dry, MD No       Preterm labor symptoms and general obstetric precautions including but not limited to vaginal bleeding, contractions, leaking of fluid and fetal movement were reviewed in detail with the patient. Please refer to After Visit Summary for other counseling recommendations.   Return in about 1 week (around 06/28/2019) for Routine Prenatal Appointment.  Prentice Docker, MD, [facility],  [facility] 06/21/2019 8:42 AM

## 2019-06-21 NOTE — Patient Instructions (Signed)

## 2019-06-23 LAB — STREP GP B NAA: Strep Gp B NAA: POSITIVE — AB

## 2019-06-24 LAB — CERVICOVAGINAL ANCILLARY ONLY
Chlamydia: NEGATIVE
Comment: NEGATIVE
Comment: NORMAL
Neisseria Gonorrhea: NEGATIVE

## 2019-07-01 ENCOUNTER — Other Ambulatory Visit: Payer: Self-pay

## 2019-07-01 ENCOUNTER — Ambulatory Visit (INDEPENDENT_AMBULATORY_CARE_PROVIDER_SITE_OTHER): Payer: BC Managed Care – PPO | Admitting: Certified Nurse Midwife

## 2019-07-01 VITALS — BP 120/80 | Wt 236.0 lb

## 2019-07-01 DIAGNOSIS — Z98891 History of uterine scar from previous surgery: Secondary | ICD-10-CM

## 2019-07-01 DIAGNOSIS — Z3A37 37 weeks gestation of pregnancy: Secondary | ICD-10-CM

## 2019-07-01 DIAGNOSIS — O34219 Maternal care for unspecified type scar from previous cesarean delivery: Secondary | ICD-10-CM

## 2019-07-01 DIAGNOSIS — O329XX0 Maternal care for malpresentation of fetus, unspecified, not applicable or unspecified: Secondary | ICD-10-CM

## 2019-07-01 DIAGNOSIS — O0993 Supervision of high risk pregnancy, unspecified, third trimester: Secondary | ICD-10-CM

## 2019-07-01 LAB — POCT URINALYSIS DIPSTICK OB
Glucose, UA: NEGATIVE
POC,PROTEIN,UA: NEGATIVE

## 2019-07-01 NOTE — Progress Notes (Signed)
C/o feet swelling like crazy; hands swell exp right and right hand get numb; has pressure.rj

## 2019-07-01 NOTE — Progress Notes (Signed)
ROB at 37wk3d: Good FM. No vaginal bleeding or leakage of fluid. Repeat CS scheduled for 07/15/18 Baby [place]/ Breast feeding/ Mirena IUD  FH 37 cm/FHT 140/ Transverse presentation on ultrasound. Ultrasound done when cervical exam closed and presenting part not in pelvis  A: IUP at 37wk3d with transverse presentation Prior CS x 2, for repeat CS 1/12  P: Preop appointment 1/4 with Dr Glennon Mac Labor precautions  Dalia Heading, CNM

## 2019-07-07 DIAGNOSIS — O329XX0 Maternal care for malpresentation of fetus, unspecified, not applicable or unspecified: Secondary | ICD-10-CM | POA: Insufficient documentation

## 2019-07-08 ENCOUNTER — Other Ambulatory Visit: Payer: Self-pay

## 2019-07-08 ENCOUNTER — Ambulatory Visit (INDEPENDENT_AMBULATORY_CARE_PROVIDER_SITE_OTHER): Payer: BC Managed Care – PPO | Admitting: Obstetrics and Gynecology

## 2019-07-08 ENCOUNTER — Encounter: Payer: Self-pay | Admitting: Obstetrics and Gynecology

## 2019-07-08 VITALS — BP 120/80 | Ht 63.0 in | Wt 233.0 lb

## 2019-07-08 DIAGNOSIS — O34219 Maternal care for unspecified type scar from previous cesarean delivery: Secondary | ICD-10-CM

## 2019-07-08 DIAGNOSIS — O09523 Supervision of elderly multigravida, third trimester: Secondary | ICD-10-CM

## 2019-07-08 DIAGNOSIS — Z3A38 38 weeks gestation of pregnancy: Secondary | ICD-10-CM

## 2019-07-08 DIAGNOSIS — Z98891 History of uterine scar from previous surgery: Secondary | ICD-10-CM

## 2019-07-08 DIAGNOSIS — O99213 Obesity complicating pregnancy, third trimester: Secondary | ICD-10-CM

## 2019-07-08 DIAGNOSIS — Z6836 Body mass index (BMI) 36.0-36.9, adult: Secondary | ICD-10-CM

## 2019-07-08 DIAGNOSIS — O99210 Obesity complicating pregnancy, unspecified trimester: Secondary | ICD-10-CM

## 2019-07-08 DIAGNOSIS — O0993 Supervision of high risk pregnancy, unspecified, third trimester: Secondary | ICD-10-CM

## 2019-07-08 NOTE — H&P (View-Only) (Signed)
OB History & Physical   History of Present Illness:  Chief Complaint: Here for preoperative appointment  HPI:  Terry Henderson is a 39 y.o. [phone] female at [redacted]w[redacted]d dated by LMP consistent with a 9 week u/s.  Her pregnancy has been complicated by advanced maternal age, history of c-section x 2, obesity with BMI 36.    She reports intermittent contractions.   She denies leakage of fluid.   She denies vaginal bleeding.   She reports fetal movement.    Total weight gain for pregnancy: 24 lb (10.9 kg)   Obstetrical Problem List: pregnancy4 Problems (from 10/12/18 to present)    Problem Noted Resolved   BMI 36.0-36.9,adult 03/19/2019 by Will Bonnet, MD No   History of 2 cesarean sections 11/20/2018 by Gae Dry, MD No   Supervision of high-risk pregnancy 11/20/2018 by Gae Dry, MD No   Overview Addendum 07/07/2019 10:07 PM by Dalia Heading, [place] Prenatal Labs  Dating LMP = 9 week [place] Blood type: AB/Positive/-- (06/23 1602)   Genetic Screen Declined Antibody:Negative (06/23 1602)  Anatomic [place] Complete 8/18 Rubella: 1.02 (06/23 1602) Varicella: Immune  GTT Early: 7               Third trimester: 128 RPR: Non Reactive (06/23 1602)   Rhogam  HBsAg: Negative (06/23 1602)   TDaP vaccine    11/13    Flu Shot: 10/16 HIV: Non Reactive (06/23 1602)   Baby Food      Breast                          GBS: positive  Contraception Mirena Pap: 08/17/2018 NIL HPV negative  CBB     CS/VBAC    Support Person            Advanced maternal age in multigravida 11/20/2018 by Gae Dry, MD No   Obesity affecting pregnancy, antepartum 11/20/2018 by Gae Dry, MD No       Maternal Medical History:   Past Medical History:  Diagnosis Date  . Abnormal Pap smear of cervix   . Chickenpox   . Hay fever   . Human papilloma virus     Past Surgical History:  Procedure Laterality Date  . CESAREAN SECTION    . CRYOTHERAPY      No Known Allergies  Prior  to Admission medications   Medication Sig Start Date End Date Taking? Authorizing Provider  Multiple Vitamins-Minerals (MULTIVITAMIN WITH MINERALS) tablet Take 1 tablet by mouth daily.   Yes [provider]    OB History  Gravida Para Term Preterm AB Living  4 2 2   1 2   SAB TAB Ectopic Multiple Live Births  1       2    # Outcome Date GA Lbr Len/2nd Weight Sex Delivery Anes PTL Lv  4 Current           3 Term     F CS-LTranv   LIV  2 Term     M CS-LTranv   LIV  1 SAB             Prenatal care site: [facility]  Social History: She  reports that she has never smoked. She has never used smokeless tobacco. She reports that she does not drink alcohol or use drugs.  Family History: family history includes Early death in her paternal grandmother; Heart attack in her  maternal grandfather and maternal grandmother; Hyperlipidemia in her maternal grandfather and maternal grandmother; Hypertension in her mother; Stroke in her maternal grandmother.   Review of Systems:  Review of Systems  Constitutional: Negative.   HENT: Negative.   Eyes: Negative.   Respiratory: Negative.   Cardiovascular: Negative.   Gastrointestinal: Negative.   Genitourinary: Negative.   Musculoskeletal: Negative.   Skin: Negative.   Neurological: Negative.   Psychiatric/Behavioral: Negative.      Physical Exam:  BP 120/80   Ht 5\' 3"  (1.6 m)   Wt 233 lb (105.7 kg)   LMP 10/12/2018 (Exact Date)   BMI 41.27 kg/m   Physical Exam Constitutional:      General: She is not in acute distress.    Appearance: Normal appearance. She is well-developed.  HENT:     Head: Normocephalic and atraumatic.  Eyes:     General: No scleral icterus.    Conjunctiva/sclera: Conjunctivae normal.  Cardiovascular:     Rate and Rhythm: Normal rate and regular rhythm.     Heart sounds: No murmur. No friction rub. No gallop.   Pulmonary:     Effort: Pulmonary effort is normal. No respiratory distress.     Breath  sounds: Normal breath sounds. No wheezing or rales.  Abdominal:     General: Bowel sounds are normal.     Palpations: Abdomen is soft.     Comments: Gravid, NT  Musculoskeletal:        General: Normal range of motion.     Cervical back: Normal range of motion and neck supple.  Neurological:     General: No focal deficit present.     Mental Status: She is alert and oriented to person, place, and time.     Cranial Nerves: No cranial nerve deficit.  Skin:    General: Skin is warm and dry.     Findings: No erythema.  Psychiatric:        Mood and Affect: Mood normal.        Behavior: Behavior normal.        Judgment: Judgment normal.   FHR: 135 BPM   No results found for: SARSCOV2NAA  Assessment:  Katiann Hubby is a 39 y.o. [phone] female at [redacted]w[redacted]d with history of cesarean section, desires repeat.   Plan:  1. Admit to Labor & Delivery  2. CBC, T&S, NPO, IVF 3. GBS positive.   4. To OR for repeat c-section. Consents signed today.      Prentice Docker, MD 07/08/2019 8:29 AM

## 2019-07-08 NOTE — Progress Notes (Signed)
OB History & Physical   History of Present Illness:  Chief Complaint: Here for preoperative appointment  HPI:  Terry Henderson is a 39 y.o. [phone] female at [redacted]w[redacted]d dated by LMP consistent with a 9 week u/s.  Her pregnancy has been complicated by advanced maternal age, history of c-section x 2, obesity with BMI 36.    She reports intermittent contractions.   She denies leakage of fluid.   She denies vaginal bleeding.   She reports fetal movement.    Total weight gain for pregnancy: 24 lb (10.9 kg)   Obstetrical Problem List: pregnancy4 Problems (from 10/12/18 to present)    Problem Noted Resolved   BMI 36.0-36.9,adult 03/19/2019 by Will Bonnet, MD No   History of 2 cesarean sections 11/20/2018 by Gae Dry, MD No   Supervision of high-risk pregnancy 11/20/2018 by Gae Dry, MD No   Overview Addendum 07/07/2019 10:07 PM by Dalia Heading, [place] Prenatal Labs  Dating LMP = 9 week [place] Blood type: AB/Positive/-- (06/23 1602)   Genetic Screen Declined Antibody:Negative (06/23 1602)  Anatomic [place] Complete 8/18 Rubella: 1.02 (06/23 1602) Varicella: Immune  GTT Early: 3               Third trimester: 128 RPR: Non Reactive (06/23 1602)   Rhogam  HBsAg: Negative (06/23 1602)   TDaP vaccine    11/13    Flu Shot: 10/16 HIV: Non Reactive (06/23 1602)   Baby Food      Breast                          GBS: positive  Contraception Mirena Pap: 08/17/2018 NIL HPV negative  CBB     CS/VBAC    Support Person            Advanced maternal age in multigravida 11/20/2018 by Gae Dry, MD No   Obesity affecting pregnancy, antepartum 11/20/2018 by Gae Dry, MD No       Maternal Medical History:   Past Medical History:  Diagnosis Date  . Abnormal Pap smear of cervix   . Chickenpox   . Hay fever   . Human papilloma virus     Past Surgical History:  Procedure Laterality Date  . CESAREAN SECTION    . CRYOTHERAPY      No Known Allergies  Prior  to Admission medications   Medication Sig Start Date End Date Taking? Authorizing Provider  Multiple Vitamins-Minerals (MULTIVITAMIN WITH MINERALS) tablet Take 1 tablet by mouth daily.   Yes [provider]    OB History  Gravida Para Term Preterm AB Living  4 2 2   1 2   SAB TAB Ectopic Multiple Live Births  1       2    # Outcome Date GA Lbr Len/2nd Weight Sex Delivery Anes PTL Lv  4 Current           3 Term     F CS-LTranv   LIV  2 Term     M CS-LTranv   LIV  1 SAB             Prenatal care site: [facility]  Social History: She  reports that she has never smoked. She has never used smokeless tobacco. She reports that she does not drink alcohol or use drugs.  Family History: family history includes Early death in her paternal grandmother; Heart attack in her  maternal grandfather and maternal grandmother; Hyperlipidemia in her maternal grandfather and maternal grandmother; Hypertension in her mother; Stroke in her maternal grandmother.   Review of Systems:  Review of Systems  Constitutional: Negative.   HENT: Negative.   Eyes: Negative.   Respiratory: Negative.   Cardiovascular: Negative.   Gastrointestinal: Negative.   Genitourinary: Negative.   Musculoskeletal: Negative.   Skin: Negative.   Neurological: Negative.   Psychiatric/Behavioral: Negative.      Physical Exam:  BP 120/80   Ht 5\' 3"  (1.6 m)   Wt 233 lb (105.7 kg)   LMP 10/12/2018 (Exact Date)   BMI 41.27 kg/m   Physical Exam Constitutional:      General: She is not in acute distress.    Appearance: Normal appearance. She is well-developed.  HENT:     Head: Normocephalic and atraumatic.  Eyes:     General: No scleral icterus.    Conjunctiva/sclera: Conjunctivae normal.  Cardiovascular:     Rate and Rhythm: Normal rate and regular rhythm.     Heart sounds: No murmur. No friction rub. No gallop.   Pulmonary:     Effort: Pulmonary effort is normal. No respiratory distress.     Breath  sounds: Normal breath sounds. No wheezing or rales.  Abdominal:     General: Bowel sounds are normal.     Palpations: Abdomen is soft.     Comments: Gravid, NT  Musculoskeletal:        General: Normal range of motion.     Cervical back: Normal range of motion and neck supple.  Neurological:     General: No focal deficit present.     Mental Status: She is alert and oriented to person, place, and time.     Cranial Nerves: No cranial nerve deficit.  Skin:    General: Skin is warm and dry.     Findings: No erythema.  Psychiatric:        Mood and Affect: Mood normal.        Behavior: Behavior normal.        Judgment: Judgment normal.   FHR: 135 BPM   No results found for: SARSCOV2NAA  Assessment:  Terry Henderson is a 39 y.o. [phone] female at [redacted]w[redacted]d with history of cesarean section, desires repeat.   Plan:  1. Admit to Labor & Delivery  2. CBC, T&S, NPO, IVF 3. GBS positive.   4. To OR for repeat c-section. Consents signed today.      Prentice Docker, MD 07/08/2019 8:29 AM

## 2019-07-11 ENCOUNTER — Encounter
Admission: RE | Admit: 2019-07-11 | Discharge: 2019-07-11 | Disposition: A | Discharge status: Home / Self Care | Payer: BC Managed Care – PPO | Source: Ambulatory Visit | Attending: Obstetrics and Gynecology | Admitting: Obstetrics and Gynecology

## 2019-07-11 ENCOUNTER — Other Ambulatory Visit: Payer: Self-pay

## 2019-07-11 NOTE — Patient Instructions (Signed)
Your procedure is scheduled on: 07-16-19 TUESDAY Report to [place] @ 5:15 AM (CALL # ON PAPERWORK WHEN YOU ARRIVE TO [facility])  Remember: Instructions that are not followed completely may result in serious medical risk, up to and including death, or upon the discretion of your surgeon and anesthesiologist your surgery may need to be rescheduled.    _x___ 1. Do not eat food after midnight the night before your procedure. NO GUM OR CANDY AFTER MIDNIGHT. You may drink clear liquids up to 2 hours before you are scheduled to arrive at the hospital for your procedure.  Do not drink clear liquids within 2 hours of your scheduled arrival to the hospital.  Clear liquids include  --Water or Apple juice without pulp  --Gatorade  --Black Coffee or Clear Tea (No milk, no creamers, do not add anything to the coffee or Tea   ____Ensure clear carbohydrate drink on the way to the hospital for bariatric patients  ____Ensure clear carbohydrate drink 3 hours before surgery.     __x__ 2. No Alcohol for 24 hours before or after surgery.   __x__3. No Smoking or e-cigarettes for 24 prior to surgery.  Do not use any chewable tobacco products for at least 6 hour prior to surgery   ____  4. Bring all medications with you on the day of surgery if instructed.    __x__ 5. Notify your doctor if there is any change in your medical condition     (cold, fever, infections).    x___6. On the morning of surgery brush your teeth with toothpaste and water.  You may rinse your mouth with mouth wash if you wish.  Do not swallow any toothpaste or mouthwash.   Do not wear jewelry, make-up, hairpins, clips or nail polish.  Do not wear lotions, powders, or perfumes.   Do not shave 48 hours prior to surgery. Men may shave face and neck.  Do not bring valuables to the hospital.    [facility] is not responsible for any belongings or valuables.               Contacts, dentures or bridgework may not be worn into  surgery.  Leave your suitcase in the car. After surgery it may be brought to your room.  For patients admitted to the hospital, discharge time is determined by your treatment team.  _  Patients discharged the day of surgery will not be allowed to drive home.  You will need someone to drive you home and stay with you the night of your procedure.    Please read over the following fact sheets that you were given:   [facility] Preparing for Surgery and or MRSA Information   ____ Take anti-hypertensive listed below, cardiac, seizure, asthma, anti-reflux and psychiatric medicines. These include:  1. NONE  2.  3.  4.  5.  6.  ____Fleets enema or Magnesium Citrate as directed.   _x___ Use CHG Soap or sage wipes as directed on instruction sheet-AVOID NIPPLE AND PRIVATE AREA  ____ Use inhalers on the day of surgery and bring to hospital day of surgery  ____ Stop Metformin and Janumet 2 days prior to surgery.    ____ Take 1/2 of usual insulin dose the night before surgery and none on the morning surgery.   ____ Follow recommendations from Cardiologist, Pulmonologist or PCP regarding stopping Aspirin, Coumadin, Plavix ,Eliquis, Effient, or Pradaxa, and Pletal.  X____Stop Anti-inflammatories such as Advil, Aleve, Ibuprofen, Motrin, Naproxen, Naprosyn,  Goodies powders or aspirin products NOW-OK to take Tylenol   ____ Stop supplements until after surgery.    ____ Bring C-Pap to the hospital.

## 2019-07-12 ENCOUNTER — Other Ambulatory Visit
Admission: RE | Admit: 2019-07-12 | Discharge: 2019-07-12 | Disposition: A | Discharge status: Home / Self Care | Payer: BC Managed Care – PPO | Source: Ambulatory Visit | Attending: Obstetrics and Gynecology | Admitting: Obstetrics and Gynecology

## 2019-07-12 DIAGNOSIS — Z01812 Encounter for preprocedural laboratory examination: Secondary | ICD-10-CM | POA: Insufficient documentation

## 2019-07-12 DIAGNOSIS — Z20822 Contact with and (suspected) exposure to covid-19: Secondary | ICD-10-CM | POA: Diagnosis not present

## 2019-07-12 LAB — CBC
HCT: 40.7 % (ref 36.0–46.0)
Hemoglobin: 13.2 g/dL (ref 12.0–15.0)
MCH: 28.9 pg (ref 26.0–34.0)
MCHC: 32.4 g/dL (ref 30.0–36.0)
MCV: 89.3 fL (ref 80.0–100.0)
Platelets: 116 10*3/uL — ABNORMAL LOW (ref 150–400)
RBC: 4.56 MIL/uL (ref 3.87–5.11)
RDW: 13.4 % (ref 11.5–15.5)
WBC: 6.4 10*3/uL (ref 4.0–10.5)
nRBC: 0 % (ref 0.0–0.2)

## 2019-07-12 LAB — TYPE AND SCREEN
ABO/RH(D): AB POS
Antibody Screen: NEGATIVE
Extend sample reason: UNDETERMINED

## 2019-07-12 LAB — RAPID HIV SCREEN (HIV 1/2 AB+AG)
HIV 1/2 Antibodies: NONREACTIVE
HIV-1 P24 Antigen - HIV24: NONREACTIVE

## 2019-07-12 LAB — SARS CORONAVIRUS 2 (TAT 6-24 HRS): SARS Coronavirus 2: NEGATIVE

## 2019-07-15 MED ORDER — CEFAZOLIN SODIUM-DEXTROSE 2-4 GM/100ML-% IV SOLN
2.0000 g | INTRAVENOUS | Status: AC
  Administered 2019-07-16: 08:00:00 2 g via INTRAVENOUS
  Filled 2019-07-15: qty 100

## 2019-07-16 ENCOUNTER — Inpatient Hospital Stay: Payer: BC Managed Care – PPO | Admitting: Anesthesiology

## 2019-07-16 ENCOUNTER — Encounter: Payer: Self-pay | Admitting: Obstetrics and Gynecology

## 2019-07-16 ENCOUNTER — Encounter
Admission: RE | Disposition: A | Discharge status: Home / Self Care | Payer: Self-pay | Source: Home / Self Care | Attending: Obstetrics and Gynecology

## 2019-07-16 ENCOUNTER — Other Ambulatory Visit: Payer: Self-pay

## 2019-07-16 ENCOUNTER — Inpatient Hospital Stay
Admission: RE | Admit: 2019-07-16 | Discharge: 2019-07-18 | DRG: 787 | Disposition: A | Discharge status: Home / Self Care | Payer: BC Managed Care – PPO | Attending: Obstetrics and Gynecology | Admitting: Obstetrics and Gynecology

## 2019-07-16 DIAGNOSIS — O99824 Streptococcus B carrier state complicating childbirth: Secondary | ICD-10-CM | POA: Diagnosis present

## 2019-07-16 DIAGNOSIS — Z6836 Body mass index (BMI) 36.0-36.9, adult: Secondary | ICD-10-CM

## 2019-07-16 DIAGNOSIS — Z3A38 38 weeks gestation of pregnancy: Secondary | ICD-10-CM

## 2019-07-16 DIAGNOSIS — E669 Obesity, unspecified: Secondary | ICD-10-CM | POA: Diagnosis not present

## 2019-07-16 DIAGNOSIS — O34219 Maternal care for unspecified type scar from previous cesarean delivery: Secondary | ICD-10-CM | POA: Diagnosis not present

## 2019-07-16 DIAGNOSIS — Z98891 History of uterine scar from previous surgery: Secondary | ICD-10-CM

## 2019-07-16 DIAGNOSIS — O99214 Obesity complicating childbirth: Secondary | ICD-10-CM | POA: Diagnosis present

## 2019-07-16 DIAGNOSIS — O09529 Supervision of elderly multigravida, unspecified trimester: Secondary | ICD-10-CM

## 2019-07-16 DIAGNOSIS — O9081 Anemia of the puerperium: Secondary | ICD-10-CM | POA: Diagnosis not present

## 2019-07-16 DIAGNOSIS — O34211 Maternal care for low transverse scar from previous cesarean delivery: Secondary | ICD-10-CM | POA: Diagnosis not present

## 2019-07-16 DIAGNOSIS — R1084 Generalized abdominal pain: Secondary | ICD-10-CM | POA: Diagnosis not present

## 2019-07-16 DIAGNOSIS — O0990 Supervision of high risk pregnancy, unspecified, unspecified trimester: Secondary | ICD-10-CM

## 2019-07-16 DIAGNOSIS — D62 Acute posthemorrhagic anemia: Secondary | ICD-10-CM | POA: Diagnosis not present

## 2019-07-16 DIAGNOSIS — G8918 Other acute postprocedural pain: Secondary | ICD-10-CM | POA: Diagnosis not present

## 2019-07-16 DIAGNOSIS — O26893 Other specified pregnancy related conditions, third trimester: Secondary | ICD-10-CM | POA: Diagnosis not present

## 2019-07-16 DIAGNOSIS — O0993 Supervision of high risk pregnancy, unspecified, third trimester: Secondary | ICD-10-CM

## 2019-07-16 DIAGNOSIS — Z3A39 39 weeks gestation of pregnancy: Secondary | ICD-10-CM | POA: Diagnosis not present

## 2019-07-16 DIAGNOSIS — O99210 Obesity complicating pregnancy, unspecified trimester: Secondary | ICD-10-CM | POA: Diagnosis present

## 2019-07-16 DIAGNOSIS — O329XX0 Maternal care for malpresentation of fetus, unspecified, not applicable or unspecified: Secondary | ICD-10-CM | POA: Diagnosis present

## 2019-07-16 LAB — CBC
HCT: 38.4 % (ref 36.0–46.0)
Hemoglobin: 12.5 g/dL (ref 12.0–15.0)
MCH: 28.8 pg (ref 26.0–34.0)
MCHC: 32.6 g/dL (ref 30.0–36.0)
MCV: 88.5 fL (ref 80.0–100.0)
Platelets: 119 K/uL — ABNORMAL LOW (ref 150–400)
RBC: 4.34 MIL/uL (ref 3.87–5.11)
RDW: 13.3 % (ref 11.5–15.5)
WBC: 6 K/uL (ref 4.0–10.5)
nRBC: 0 % (ref 0.0–0.2)

## 2019-07-16 LAB — TYPE AND SCREEN
ABO/RH(D): AB POS
Antibody Screen: NEGATIVE

## 2019-07-16 SURGERY — Surgical Case
Anesthesia: Epidural

## 2019-07-16 MED ORDER — OXYTOCIN 40 UNITS IN NORMAL SALINE INFUSION - SIMPLE MED
2.5000 [IU]/h | INTRAVENOUS | Status: AC
  Filled 2019-07-16: qty 1000

## 2019-07-16 MED ORDER — FENTANYL CITRATE (PF) 100 MCG/2ML IJ SOLN
INTRAMUSCULAR | Status: DC | PRN
  Administered 2019-07-16: 15 ug via INTRATHECAL

## 2019-07-16 MED ORDER — IBUPROFEN 600 MG PO TABS
600.0000 mg | ORAL_TABLET | Freq: Four times a day (QID) | ORAL | Status: DC
  Administered 2019-07-17 – 2019-07-18 (×5): 600 mg via ORAL
  Filled 2019-07-16 (×5): qty 1

## 2019-07-16 MED ORDER — ACETAMINOPHEN 500 MG PO TABS
1000.0000 mg | ORAL_TABLET | Freq: Four times a day (QID) | ORAL | Status: DC
  Administered 2019-07-16 (×2): 1000 mg via ORAL
  Filled 2019-07-16 (×2): qty 2

## 2019-07-16 MED ORDER — FENTANYL CITRATE (PF) 100 MCG/2ML IJ SOLN
INTRAMUSCULAR | Status: AC
  Filled 2019-07-16: qty 2

## 2019-07-16 MED ORDER — LACTATED RINGERS IV SOLN: Freq: Once | INTRAVENOUS | Status: DC

## 2019-07-16 MED ORDER — NALOXONE HCL 0.4 MG/ML IJ SOLN: 0.4000 mg | INTRAMUSCULAR | Status: DC | PRN

## 2019-07-16 MED ORDER — NALBUPHINE HCL 10 MG/ML IJ SOLN: 5.0000 mg | Freq: Once | INTRAMUSCULAR | Status: DC | PRN

## 2019-07-16 MED ORDER — MEPERIDINE HCL 25 MG/ML IJ SOLN: 6.2500 mg | INTRAMUSCULAR | Status: DC | PRN

## 2019-07-16 MED ORDER — OXYCODONE-ACETAMINOPHEN 5-325 MG PO TABS
1.0000 | ORAL_TABLET | ORAL | Status: DC | PRN
  Administered 2019-07-17 – 2019-07-18 (×3): 1 via ORAL
  Filled 2019-07-16 (×3): qty 1

## 2019-07-16 MED ORDER — BUPIVACAINE HCL (PF) 0.5 % IJ SOLN: 5.0000 mL | Freq: Once | INTRAMUSCULAR | Status: DC

## 2019-07-16 MED ORDER — CEFAZOLIN SODIUM 1 G IJ SOLR
INTRAMUSCULAR | Status: AC
  Filled 2019-07-16: qty 10

## 2019-07-16 MED ORDER — OXYTOCIN 40 UNITS IN NORMAL SALINE INFUSION - SIMPLE MED
INTRAVENOUS | Status: DC | PRN
  Administered 2019-07-16: 1000 mL via INTRAVENOUS

## 2019-07-16 MED ORDER — LACTATED RINGERS IV SOLN: INTRAVENOUS | Status: DC | PRN

## 2019-07-16 MED ORDER — OXYTOCIN 40 UNITS IN NORMAL SALINE INFUSION - SIMPLE MED
INTRAVENOUS | Status: AC
  Filled 2019-07-16: qty 1000

## 2019-07-16 MED ORDER — NALOXONE HCL 4 MG/10ML IJ SOLN
1.0000 ug/kg/h | INTRAVENOUS | Status: DC | PRN
  Filled 2019-07-16: qty 5

## 2019-07-16 MED ORDER — DEXAMETHASONE SODIUM PHOSPHATE 10 MG/ML IJ SOLN
INTRAMUSCULAR | Status: AC
  Filled 2019-07-16: qty 1

## 2019-07-16 MED ORDER — BUPIVACAINE HCL 0.5 % IJ SOLN
INTRAMUSCULAR | Status: DC | PRN
  Administered 2019-07-16: 30 mL

## 2019-07-16 MED ORDER — BUPIVACAINE 0.25 % ON-Q PUMP DUAL CATH 400 ML
400.0000 mL | INJECTION | Status: DC
  Filled 2019-07-16: qty 400

## 2019-07-16 MED ORDER — NALBUPHINE HCL 10 MG/ML IJ SOLN: 5.0000 mg | INTRAMUSCULAR | Status: DC | PRN

## 2019-07-16 MED ORDER — DEXAMETHASONE SODIUM PHOSPHATE 4 MG/ML IJ SOLN
INTRAMUSCULAR | Status: DC | PRN
  Administered 2019-07-16: 10 mg via INTRAVENOUS

## 2019-07-16 MED ORDER — ONDANSETRON HCL 4 MG/2ML IJ SOLN
INTRAMUSCULAR | Status: AC
  Filled 2019-07-16: qty 2

## 2019-07-16 MED ORDER — SOD CITRATE-CITRIC ACID 500-334 MG/5ML PO SOLN
30.0000 mL | ORAL | Status: AC
  Administered 2019-07-16: 30 mL via ORAL
  Filled 2019-07-16: qty 30

## 2019-07-16 MED ORDER — ONDANSETRON HCL 4 MG/2ML IJ SOLN
INTRAMUSCULAR | Status: DC | PRN
  Administered 2019-07-16: 4 mg via INTRAVENOUS

## 2019-07-16 MED ORDER — MENTHOL 3 MG MT LOZG
1.0000 | LOZENGE | OROMUCOSAL | Status: DC | PRN
  Filled 2019-07-16: qty 9

## 2019-07-16 MED ORDER — KETOROLAC TROMETHAMINE 30 MG/ML IJ SOLN
30.0000 mg | Freq: Four times a day (QID) | INTRAMUSCULAR | Status: AC | PRN
  Administered 2019-07-16 – 2019-07-17 (×2): 30 mg via INTRAVENOUS
  Filled 2019-07-16 (×2): qty 1

## 2019-07-16 MED ORDER — ENOXAPARIN SODIUM 40 MG/0.4ML ~~LOC~~ SOLN
40.0000 mg | Freq: Two times a day (BID) | SUBCUTANEOUS | Status: DC
  Administered 2019-07-17 – 2019-07-18 (×3): 40 mg via SUBCUTANEOUS
  Filled 2019-07-16 (×3): qty 0.4

## 2019-07-16 MED ORDER — SENNOSIDES-DOCUSATE SODIUM 8.6-50 MG PO TABS
2.0000 | ORAL_TABLET | ORAL | Status: DC
  Administered 2019-07-17 (×2): 2 via ORAL
  Filled 2019-07-16 (×2): qty 2

## 2019-07-16 MED ORDER — ACETAMINOPHEN 500 MG PO TABS
1000.0000 mg | ORAL_TABLET | Freq: Four times a day (QID) | ORAL | Status: AC
  Administered 2019-07-17 (×2): 1000 mg via ORAL
  Filled 2019-07-16 (×2): qty 2

## 2019-07-16 MED ORDER — OXYCODONE-ACETAMINOPHEN 5-325 MG PO TABS: 2.0000 | ORAL_TABLET | ORAL | Status: DC | PRN

## 2019-07-16 MED ORDER — WITCH HAZEL-GLYCERIN EX PADS: 1.0000 "application " | MEDICATED_PAD | CUTANEOUS | Status: DC | PRN

## 2019-07-16 MED ORDER — SODIUM CHLORIDE 0.9% FLUSH: 3.0000 mL | INTRAVENOUS | Status: DC | PRN

## 2019-07-16 MED ORDER — MORPHINE SULFATE (PF) 0.5 MG/ML IJ SOLN
INTRAMUSCULAR | Status: AC
  Filled 2019-07-16: qty 10

## 2019-07-16 MED ORDER — BUPIVACAINE IN DEXTROSE 0.75-8.25 % IT SOLN
INTRATHECAL | Status: DC | PRN
  Administered 2019-07-16: 1.6 mL via INTRATHECAL

## 2019-07-16 MED ORDER — PRENATAL MULTIVITAMIN CH
1.0000 | ORAL_TABLET | Freq: Every day | ORAL | Status: DC
  Administered 2019-07-16 – 2019-07-18 (×3): 1 via ORAL
  Filled 2019-07-16 (×3): qty 1

## 2019-07-16 MED ORDER — MORPHINE SULFATE (PF) 0.5 MG/ML IJ SOLN
INTRAMUSCULAR | Status: DC | PRN
  Administered 2019-07-16: 100 ug via INTRATHECAL

## 2019-07-16 MED ORDER — ONDANSETRON HCL 4 MG/2ML IJ SOLN: 4.0000 mg | Freq: Once | INTRAMUSCULAR | Status: DC | PRN

## 2019-07-16 MED ORDER — FERROUS SULFATE 325 (65 FE) MG PO TABS
325.0000 mg | ORAL_TABLET | Freq: Two times a day (BID) | ORAL | Status: DC
  Administered 2019-07-16 – 2019-07-17 (×2): 325 mg via ORAL
  Filled 2019-07-16 (×2): qty 1

## 2019-07-16 MED ORDER — LACTATED RINGERS IV SOLN: INTRAVENOUS | Status: DC

## 2019-07-16 MED ORDER — KETOROLAC TROMETHAMINE 30 MG/ML IJ SOLN
INTRAMUSCULAR | Status: AC
  Filled 2019-07-16: qty 1

## 2019-07-16 MED ORDER — DIPHENHYDRAMINE HCL 25 MG PO CAPS: 25.0000 mg | ORAL_CAPSULE | Freq: Four times a day (QID) | ORAL | Status: DC | PRN

## 2019-07-16 MED ORDER — KETOROLAC TROMETHAMINE 30 MG/ML IJ SOLN: 30.0000 mg | Freq: Four times a day (QID) | INTRAMUSCULAR | Status: AC | PRN

## 2019-07-16 MED ORDER — COCONUT OIL OIL
1.0000 "application " | TOPICAL_OIL | Status: DC | PRN
  Filled 2019-07-16: qty 120

## 2019-07-16 MED ORDER — ONDANSETRON HCL 4 MG/2ML IJ SOLN: 4.0000 mg | Freq: Three times a day (TID) | INTRAMUSCULAR | Status: DC | PRN

## 2019-07-16 MED ORDER — DIBUCAINE (PERIANAL) 1 % EX OINT: 1.0000 "application " | TOPICAL_OINTMENT | CUTANEOUS | Status: DC | PRN

## 2019-07-16 MED ORDER — PHENYLEPHRINE HCL-NACL 10-0.9 MG/250ML-% IV SOLN
INTRAVENOUS | Status: DC | PRN
  Administered 2019-07-16: 30 ug/min via INTRAVENOUS

## 2019-07-16 MED ORDER — KETOROLAC TROMETHAMINE 30 MG/ML IJ SOLN
INTRAMUSCULAR | Status: DC | PRN
  Administered 2019-07-16: 15 mg via INTRAVENOUS

## 2019-07-16 MED ORDER — FENTANYL CITRATE (PF) 100 MCG/2ML IJ SOLN: 25.0000 ug | INTRAMUSCULAR | Status: DC | PRN

## 2019-07-16 MED ORDER — ACETAMINOPHEN 10 MG/ML IV SOLN
INTRAVENOUS | Status: DC | PRN
  Administered 2019-07-16: 1000 mg via INTRAVENOUS

## 2019-07-16 MED ORDER — PROPOFOL 10 MG/ML IV BOLUS
INTRAVENOUS | Status: AC
  Filled 2019-07-16: qty 20

## 2019-07-16 MED ORDER — BUPIVACAINE HCL (PF) 0.5 % IJ SOLN
5.0000 mL | Freq: Once | INTRAMUSCULAR | Status: DC
  Filled 2019-07-16: qty 30

## 2019-07-16 MED ORDER — SIMETHICONE 80 MG PO CHEW
80.0000 mg | CHEWABLE_TABLET | Freq: Three times a day (TID) | ORAL | Status: DC
  Administered 2019-07-16 – 2019-07-18 (×6): 80 mg via ORAL
  Filled 2019-07-16 (×6): qty 1

## 2019-07-16 MED ORDER — ACETAMINOPHEN 10 MG/ML IV SOLN
INTRAVENOUS | Status: AC
  Filled 2019-07-16: qty 100

## 2019-07-16 SURGICAL SUPPLY — 29 items
CANISTER SUCT 3000ML PPV (MISCELLANEOUS) ×2 IMPLANT
CATH KIT ON-Q SILVERSOAK 5IN (CATHETERS) ×4 IMPLANT
COVER WAND RF STERILE (DRAPES) ×2 IMPLANT
DERMABOND ADVANCED (GAUZE/BANDAGES/DRESSINGS) ×2
DERMABOND ADVANCED .7 DNX12 (GAUZE/BANDAGES/DRESSINGS) ×2 IMPLANT
DRSG OPSITE POSTOP 4X10 (GAUZE/BANDAGES/DRESSINGS) ×2 IMPLANT
DRSG TELFA 3X8 NADH (GAUZE/BANDAGES/DRESSINGS) ×2 IMPLANT
ELECT CAUTERY BLADE 6.4 (BLADE) ×2 IMPLANT
ELECT REM PT RETURN 9FT ADLT (ELECTROSURGICAL) ×2
ELECTRODE REM PT RTRN 9FT ADLT (ELECTROSURGICAL) ×1 IMPLANT
GAUZE SPONGE 4X4 12PLY STRL (GAUZE/BANDAGES/DRESSINGS) ×2 IMPLANT
GLOVE BIO SURGEON STRL SZ7 (GLOVE) ×2 IMPLANT
GLOVE INDICATOR 7.5 STRL GRN (GLOVE) ×2 IMPLANT
GOWN STRL REUS W/ TWL LRG LVL3 (GOWN DISPOSABLE) ×3 IMPLANT
GOWN STRL REUS W/TWL LRG LVL3 (GOWN DISPOSABLE) ×3
NS IRRIG 1000ML POUR BTL (IV SOLUTION) ×2 IMPLANT
PACK C SECTION AR (MISCELLANEOUS) ×2 IMPLANT
PAD OB MATERNITY 4.3X12.25 (PERSONAL CARE ITEMS) ×4 IMPLANT
PAD PREP 24X41 OB/GYN DISP (PERSONAL CARE ITEMS) ×2 IMPLANT
PENCIL SMOKE ULTRAEVAC 22 CON (MISCELLANEOUS) ×2 IMPLANT
STRIP CLOSURE SKIN 1/2X4 (GAUZE/BANDAGES/DRESSINGS) ×2 IMPLANT
SUT MNCRL 4-0 (SUTURE) ×1
SUT MNCRL 4-0 27XMFL (SUTURE) ×1
SUT PDS AB 1 TP1 96 (SUTURE) ×2 IMPLANT
SUT PLAIN GUT 0 (SUTURE) ×4 IMPLANT
SUT VIC AB 0 CTX 36 (SUTURE) ×2
SUT VIC AB 0 CTX36XBRD ANBCTRL (SUTURE) ×2 IMPLANT
SUTURE MNCRL 4-0 27XMF (SUTURE) ×1 IMPLANT
SWABSTK COMLB BENZOIN TINCTURE (MISCELLANEOUS) ×2 IMPLANT

## 2019-07-16 NOTE — Lactation Note (Signed)
This note was copied from a baby's chart. Lactation Consultation Note  Patient Name: Terry Henderson [id number] Date: 07/16/2019     P3 with a 67 and 39 year old who were breastfed briefly, reported excitement to breastfeed current baby.  Baby was latched onto right breast in cross-cradle position.   LC provided education about normal newborn behavior and implications of c-section and breech birth.  MOB inquired about signs of when to switch breasts. Cue-based feeding education provided.   As per family's request, DEBP provided and set up in room. Pump cleaning instructions provided. Coconut oil provided.    FOB and MOB reported all questions have been answered.   Maternal Data Does the patient have breastfeeding experience prior to this delivery?: Yes  Feeding Feeding Type: Breast Fed  LATCH Score Latch: Too sleepy or reluctant, no latch achieved, no sucking elicited.                 Interventions    Lactation Tools Discussed/Used     Consult Status      [name] 07/16/2019, 12:38 PM

## 2019-07-16 NOTE — Progress Notes (Signed)
Anticoagulation monitoring(Lovenox):  38yo  F ordered Lovenox 40 mg Q24h  Filed Weights   07/16/19 0535  Weight: 233 lb (105.7 kg)   BMI 41.27   Lab Results  Component Value Date   CREATININE 0.69 08/14/2018   CrCl cannot be calculated (Patient's most recent lab result is older than the maximum 21 days allowed.). Hemoglobin & Hematocrit     Component Value Date/Time   HGB 12.5 07/16/2019 0535   HGB 12.2 04/19/2019 1017   HCT 38.4 07/16/2019 0535   HCT 38.0 04/19/2019 1017     Per Protocol for Patient with estCrcl >30 ml/min and BMI > 40, will transition to Lovenox 40 mg Q12h.    F/u Scr in am  Chinita Greenland PharmD Clinical Pharmacist 07/16/2019

## 2019-07-16 NOTE — Discharge Summary (Signed)
OB Discharge Summary     Patient Name: Terry Henderson DOB: 07-25-1980 MRN: [id number]  Date of admission: 07/16/2019 Delivering MD: Prentice Docker, MD  Date of Delivery: 07/16/2019  Date of discharge: 07/18/2019  Admitting diagnosis: History of cesarean delivery [Z98.891] Intrauterine pregnancy: [redacted]w[redacted]d     Secondary diagnosis: None     Discharge diagnosis: Term Pregnancy Delivered                                                                                                Post partum procedures:none   Augmentation: n/a  Complications: None  Hospital course:  Sceduled C/S   39 y.o. yo [id number] at [redacted]w[redacted]d was admitted to the hospital 07/16/2019 for scheduled cesarean section with the following indication:Elective Repeat.  Membrane Rupture Time/Date: 8:23 AM ,07/16/2019   Patient delivered a Viable infant.07/16/2019  Details of operation can be found in separate operative note.  Pateint had an uncomplicated postpartum course.  She is ambulating, tolerating a regular diet, passing flatus, and urinating well. Patient is discharged home in stable condition on  07/18/19         Physical exam  Vitals:   07/17/19 0750 07/17/19 1653 07/17/19 2345 07/18/19 0730  BP: 118/74 128/74 118/75 120/72  Pulse: 76 80 78 78  Resp: 20 18 18 17   Temp: 98.4 F (36.9 C) 98.1 F (36.7 C) 98.8 F (37.1 C) 98.2 F (36.8 C)  TempSrc: Oral Oral Oral Oral  SpO2: 99%  97% 99%  Weight:      Height:       General: alert, cooperative and no distress Lochia: appropriate Uterine Fundus: firm Incision: Healing well with no significant drainage DVT Evaluation: No evidence of DVT seen on physical exam.  Labs: Lab Results  Component Value Date   WBC 9.1 07/17/2019   HGB 10.0 (L) 07/17/2019   HCT 30.3 (L) 07/17/2019   MCV 89.4 07/17/2019   PLT 94 (L) 07/17/2019    Discharge instruction: per After Visit Summary.  Medications:  Allergies as of 07/18/2019   No Known Allergies     Medication List     TAKE these medications   ferrous sulfate 325 (65 FE) MG tablet Take 1 tablet (325 mg total) by mouth daily with breakfast. Start taking on: July 19, 2019   ibuprofen 600 MG tablet Commonly known as: ADVIL Take 1 tablet (600 mg total) by mouth every 6 (six) hours.   multivitamin with minerals tablet Take 1 tablet by mouth daily.   oxyCODONE-acetaminophen 5-325 MG tablet Commonly known as: PERCOCET/ROXICET Take 1 tablet by mouth every 4 (four) hours as needed for moderate pain (pain score 4-7/10).            Discharge Care Instructions  (From admission, onward)         Start     Ordered   07/18/19 0000  Discharge wound care:    Comments: You may apply a light dressing for minor discharge from the incision or to keep waistbands of clothing from rubbing.  You may also have been discharge with a clear dressing in which case this will  be removed at your postoperative clinic visit.  You may shower, use soap on your incision.  Avoid baths or soaking the incision in the first 6 weeks following your surgery.Marland Kitchen   07/18/19 0930          Diet: routine diet  Activity: Advance as tolerated. Pelvic rest for 6 weeks.   Outpatient follow up: Follow-up Information    Will Bonnet, MD. Go on 07/22/2019.   Specialty: Obstetrics and Gynecology Why: Keep previously scheduled postop incision check Contact information: [address] [place] [address] [phone]             Postpartum contraception: IUD Mirena Rhogam Given postpartum: no Rubella vaccine given postpartum: no Varicella vaccine given postpartum: no TDaP given antepartum or postpartum: 11/13/20202 Influenza Vaccine: 04/19/2019  Newborn Data: Live born female [name]) Birth Weight: 8 lb 8.2 oz (3860 g) APGAR: 4, 8, 9  Newborn Delivery   Birth date/time: 07/16/2019 08:24:00 Delivery type: C-Section, Low Transverse Trial of labor: No C-section categorization: Repeat     Baby Feeding:  Breast  Disposition:home with mother  SIGNED: Malachy Mood, MD, [place] 07/18/2019, 9:31 AM

## 2019-07-16 NOTE — Anesthesia Preprocedure Evaluation (Signed)
Anesthesia Evaluation  Patient identified by MRN, date of birth, ID band Patient awake    Reviewed: Allergy & Precautions, H&P , NPO status , Patient's Chart, lab work & pertinent test results, reviewed documented beta blocker date and time   Airway Mallampati: II  TM Distance: >3 FB Neck ROM: full    Dental no notable dental hx. (+) Teeth Intact   Pulmonary neg pulmonary ROS, Current Smoker,    Pulmonary exam normal breath sounds clear to auscultation       Cardiovascular Exercise Tolerance: Good negative cardio ROS   Rhythm:regular Rate:Normal     Neuro/Psych negative neurological ROS  negative psych ROS   GI/Hepatic negative GI ROS, Neg liver ROS,   Endo/Other  negative endocrine ROSdiabetes, Well Controlled, Gestational  Renal/GU      Musculoskeletal   Abdominal   Peds  Hematology negative hematology ROS (+)   Anesthesia Other Findings   Reproductive/Obstetrics (+) Pregnancy                             Anesthesia Physical Anesthesia Plan  ASA: II  Anesthesia Plan: Epidural   Post-op Pain Management:    Induction:   PONV Risk Score and Plan:   Airway Management Planned:   Additional Equipment:   Intra-op Plan:   Post-operative Plan:   Informed Consent: I have reviewed the patients History and Physical, chart, labs and discussed the procedure including the risks, benefits and alternatives for the proposed anesthesia with the patient or authorized representative who has indicated his/her understanding and acceptance.       Plan Discussed with:   Anesthesia Plan Comments:         Anesthesia Quick Evaluation

## 2019-07-16 NOTE — Interval H&P Note (Signed)
History and Physical Interval Note:  07/16/2019 7:25 AM  Terry Henderson  has presented today for surgery, with the diagnosis of Repeat cesarean section.  The various methods of treatment have been discussed with the patient and family. After consideration of risks, benefits and other options for treatment, the patient has consented to  Procedure(s): CESAREAN SECTION (N/A) as a surgical intervention.  The patient's history has been reviewed, patient examined, no change in status, stable for surgery.  I have reviewed the patient's chart and labs.  Questions were answered to the patient's satisfaction.    Prentice Docker, MD, [facility], [facility] 07/16/2019 7:25 AM

## 2019-07-16 NOTE — Op Note (Signed)
Cesarean Section Operative Note    Terry Henderson   07/16/2019   Pre-operative Diagnosis:  1) History of cesarean section, desires repeat 2) intrauterine pregnancy at [redacted]w[redacted]d   Post-operative Diagnosis:  1) History of cesarean section, desires repeat 2) intrauterine pregnancy at [redacted]w[redacted]d    Procedure: Repeat Low Transverse Cesarean Section via Pfannenstiel incision with double layer uterine closure  Surgeon: Surgeon(s) and Role:    Will Bonnet, MD - Primary    * Homero Fellers, MD - Assisting   Assistants: Dr. Adrian Prows; No other capable assistant available, in surgery requiring high level assistant.  Anesthesia: spinal   Findings:  1) normal appearing gravid uterus, fallopian tubes, and ovaries 2) viable female infant with weight 3,860 grams, APGARs 4, 8, and 9   Quantitative Blood Loss: 563 mL  Total IV Fluids: 1,500 ml cyrstalloid  Urine Output: 100 mL clear urine at end of case  Specimens: none  Complications: no complications  Disposition: PACU - hemodynamically stable.   Maternal Condition: stable   Baby condition / location:  Couplet care / Skin to Skin  Procedure Details:  The patient was seen in the Holding Room. The risks, benefits, complications, treatment options, and expected outcomes were discussed with the patient. The patient concurred with the proposed plan, giving informed consent. identified as Terry Henderson and the procedure verified as C-Section Delivery. A Time Out was held and the above information confirmed.   After induction of anesthesia, the patient was draped and prepped in the usual sterile manner. A Pfannenstiel incision was made and carried down through the subcutaneous tissue to the fascia. Fascial incision was made and extended transversely. The fascia was separated from the underlying rectus tissue superiorly and inferiorly. The peritoneum was identified and entered. Peritoneal incision was extended longitudinally. The  bladder flap was not bluntly or sharply freed from the lower uterine segment. A low transverse uterine incision was made and the hysterotomy was extended with cranial-caudal tension. Delivered from breech (footling) presentation in the standard fashion was a 3,860 gram Living newborn infant(s) or Female with Apgar scores of 4 at one minute, 8 at five minutes, and 9 at 10 minutes. Cord ph was not sent the umbilical cord was clamped and cut cord blood was not obtained for evaluation. The placenta was removed Intact and appeared normal. The uterine outline, tubes and ovaries appeared normal. The uterine incision was closed with running locked sutures of 0 Vicryl.  A second layer of the same suture was thrown in an imbricating fashion.  A figure-of-eigth stitch was thrown at the mid-incision level and hemostasis was assured.  The uterus was returned to the abdomen and the paracolic gutters were cleared of all clots and debris.  The rectus muscles were inspected and found to be hemostatic.  The On-Q catheter pumps were inserted in accordance with the manufacturer's recommendations.  The catheters were inserted approximately 4cm cephelad to the incision line, approximately 1cm apart, straddling the midline.  They were inserted to a depth of the 3rd mark. They were positioned superficial to the rectus abdominus muscles and deep to the rectus fascia.    The fascia was then reapproximated with running sutures of 1-0 PDS, looped. The subcutaneous tissue was reapproximated using 2-0 plain gut such that no greater than 2cm of dead space remained. The subcuticular closure was performed using 4-0 monocryl. The skin closure was reinforced using surgical skin glue.  The On-Q catheters were bolused with 5 mL of 0.5% marcaine plain  for a total of 10 mL.  The catheters were affixed to the skin with surgical skin glue, steri-strips, and tegaderm.    The surgical assistant performed tissue retraction, assistance with suturing,  fundal pressure, and hemostasis.    Instrument, sponge, and needle counts were correct prior the abdominal closure and were correct at the conclusion of the case.  The patient received Ancef 2 gram IV prior to skin incision (within 30 minutes). For VTE prophylaxis she was wearing SCDs throughout the case.  The assistant surgeon was an MD due to lack of availability of another Counselling psychologist.    Signed: Will Bonnet, MD 07/16/2019 9:21 AM

## 2019-07-16 NOTE — Transfer of Care (Signed)
Immediate Anesthesia Transfer of Care Note  Patient: Terry Henderson  Procedure(s) Performed: CESAREAN SECTION (N/A )  Patient Location: PACU  Anesthesia Type:General  Level of Consciousness: awake  Airway & Oxygen Therapy: Patient Spontanous Breathing  Post-op Assessment: Report given to RN and Post -op Vital signs reviewed and stable  Post vital signs: Reviewed and stable  Last Vitals:  Vitals Value Taken Time  BP    Temp    Pulse    Resp    SpO2      Last Pain:  Vitals:   07/16/19 0535  TempSrc: Oral         Complications: No apparent anesthesia complications

## 2019-07-16 NOTE — Consult Note (Signed)
Neonatology Note:   Attendance at C-section:    I was asked by Dr. Glennon Mac to attend this repeat C/S at term. The mother is a [id number], GBS + with good prenatal care complicated by AMA, obesity and breech positioning. ROM 0h 46m prior to delivery, fluid clear. Infant not vigorous nor with good spontaneous cry and tone.  Cord cut and baby brought to warmer immediately where he was dried and stimulated.  Stunned appearance.  HR <100 initially but improved with stim.  Some improvements in cry and tone.  Poor aeration and color change.  Sao2 placed and in 70s at 4 min of life.  BBO2 started with SAo2 improvements.  Lungs remained very coarse.  CPAP started.  Able to wean fio2 to 21% gradually.  Improved color, tone, affect and lung aeration. Ap 4/8/9.  Father at bedside throughout.  Mother updated.  To CN to care of Pediatrician.  Monia Sabal Katherina Mires, MD Neonatologist 07/16/2019, 9:13 AM

## 2019-07-16 NOTE — Anesthesia Procedure Notes (Signed)
Spinal  Patient location during procedure: OR Staffing Performed: anesthesiologist  Preanesthetic Checklist Completed: patient identified, IV checked, site marked, risks and benefits discussed, surgical consent, monitors and equipment checked, pre-op evaluation and timeout performed Spinal Block Patient position: sitting Prep: Betadine Patient monitoring: heart rate, continuous pulse ox, blood pressure and cardiac monitor Approach: midline Location: L4-5 Injection technique: single-shot Needle Needle type: Whitacre and Introducer  Needle gauge: 24 G Needle length: 9 cm Assessment Sensory level: T4 Additional Notes Negative paresthesia. Negative blood return. Positive free-flowing CSF. Expiration date of kit checked and confirmed. Patient tolerated procedure well, without complications.       

## 2019-07-17 LAB — CBC
HCT: 30.3 % — ABNORMAL LOW (ref 36.0–46.0)
Hemoglobin: 10 g/dL — ABNORMAL LOW (ref 12.0–15.0)
MCH: 29.5 pg (ref 26.0–34.0)
MCHC: 33 g/dL (ref 30.0–36.0)
MCV: 89.4 fL (ref 80.0–100.0)
Platelets: 94 10*3/uL — ABNORMAL LOW (ref 150–400)
RBC: 3.39 MIL/uL — ABNORMAL LOW (ref 3.87–5.11)
RDW: 13.2 % (ref 11.5–15.5)
WBC: 9.1 10*3/uL (ref 4.0–10.5)
nRBC: 0 % (ref 0.0–0.2)

## 2019-07-17 LAB — CREATININE, SERUM
Creatinine, Ser: 0.54 mg/dL (ref 0.44–1.00)
GFR calc Af Amer: >60 mL/min (ref >60)
GFR calc non Af Amer: >60 mL/min (ref >60)

## 2019-07-17 MED ORDER — FERROUS SULFATE 325 (65 FE) MG PO TABS
325.0000 mg | ORAL_TABLET | Freq: Every day | ORAL | Status: DC
  Administered 2019-07-18: 325 mg via ORAL
  Filled 2019-07-17: qty 1

## 2019-07-17 NOTE — Lactation Note (Signed)
This note was copied from a baby's chart. Lactation Consultation Note  Patient Name: Terry Henderson Date: 07/17/2019 Reason for consult: Follow-up assessment  LC in to see mom and baby Terry Henderson. Baby had just returned from circumcision and was skin to skin with dad while mom was resting. Mom reported some difficulties overnight with baby spitting, and having trouble breathing, but feels it has resolved at this time. Mom reports feedings to have been going well, better than previous experiences with other 2 children. Baby is latching well, and cuing often.  LC provided education for growth spurts/cluster feeding, the need for feeding on cue, milk supply/demand, normal course of lactation, and baby's behavior for the next few hours after circumcision. Mom did utilize the pump yesterday when baby was sleepy, and was encouraged to do the same today if baby repeats behavior.  Mom had no questions and desired to rest, encouraged to call out today for assistance or breastfeeding support if needed. LC name/number on whiteboard.  Maternal Data Formula Feeding for Exclusion: No Has patient been taught Hand Expression?: Yes Does the patient have breastfeeding experience prior to this delivery?: Yes  Feeding    LATCH Score                   Interventions Interventions: Breast feeding basics reviewed;DEBP  Lactation Tools Discussed/Used Pump Review: Setup, frequency, and cleaning;Milk Storage Date initiated:: 07/16/19   Consult Status Consult Status: Follow-up Date: 07/17/19 Follow-up type: In-patient    [name] 07/17/2019, 10:01 AM

## 2019-07-17 NOTE — Plan of Care (Signed)
Vs stable; up with assistance; pt did do well standing at bedside and then ambulating to bathroom for foley removal and peri care; taking tylenol and toradol for pain control; breastfeeding; tolerating regular diet

## 2019-07-17 NOTE — Anesthesia Postprocedure Evaluation (Signed)
Anesthesia Post Note  Patient: Terry Henderson  Procedure(s) Performed: CESAREAN SECTION (N/A )  Patient location during evaluation: Mother Baby Anesthesia Type: Epidural Level of consciousness: oriented and awake and alert Pain management: pain level controlled Vital Signs Assessment: post-procedure vital signs reviewed and stable Respiratory status: spontaneous breathing and respiratory function stable Cardiovascular status: blood pressure returned to baseline and stable Postop Assessment: no headache, no backache, no apparent nausea or vomiting and able to ambulate Anesthetic complications: no     Last Vitals:  Vitals:   07/17/19 0305 07/17/19 0500  BP: 124/67   Pulse: 68 85  Resp: 20   Temp: 36.6 C   SpO2: 96% 93%    Last Pain:  Vitals:   07/17/19 0330  TempSrc:   PainSc: 5                  Caryl Asp

## 2019-07-17 NOTE — Progress Notes (Signed)
POD #1 Subjective:   Doing well. Good pain relief with the ON Q pump. Tolerating regular diet. Voiding without difficulty since foley discontinued. Baby had some difficulty bringing up mucous last night, but finally cleared the mucous from his airway by morning. Currently having circumcision done.   Objective:  Blood pressure 118/74, pulse 76, temperature 98.4 F (36.9 C), temperature source Oral, resp. rate 20, height 5\' 3"  (1.6 m), weight 105.7 kg, last menstrual period 10/12/2018, SpO2 99 %, unknown if currently breastfeeding.  General: WF in NAD Pulmonary: no increased work of breathing/ CTAB Abdomen: non-distended, appropriately tender, bowel sounds active x 4,  fundus firm at level of umbilicus-3FB Incision: Honey comb dressing intact, ON Q intact Extremities: pitting edema of lower extremities present but "better", no erythema, no tenderness  Results for orders placed or performed during the hospital encounter of 07/16/19 (from the past 72 hour(s))  Type and screen [place]     Status: None   Collection Time: 07/16/19  5:35 AM  Result Value Ref Range   ABO/RH(D) AB POS    Antibody Screen NEG    Sample Expiration      07/19/2019,2359 Performed at [facility], [address] [place], [place] [address]   CBC     Status: Abnormal   Collection Time: 07/16/19  5:35 AM  Result Value Ref Range   WBC 6.0 4.0 - 10.5 K/uL   RBC 4.34 3.87 - 5.11 MIL/uL   Hemoglobin 12.5 12.0 - 15.0 g/dL   HCT 38.4 36.0 - 46.0 %   MCV 88.5 80.0 - 100.0 fL   MCH 28.8 26.0 - 34.0 pg   MCHC 32.6 30.0 - 36.0 g/dL   RDW 13.3 11.5 - 15.5 %   Platelets 119 (L) 150 - 400 K/uL    Comment: Immature Platelet Fraction may be clinically indicated, consider ordering this additional test [id number]    nRBC 0.0 0.0 - 0.2 %    Comment: Performed at [facility], [address] [place], [place] [address]  CBC     Status: Abnormal   Collection Time: 07/17/19  5:25 AM   Result Value Ref Range   WBC 9.1 4.0 - 10.5 K/uL   RBC 3.39 (L) 3.87 - 5.11 MIL/uL   Hemoglobin 10.0 (L) 12.0 - 15.0 g/dL   HCT 30.3 (L) 36.0 - 46.0 %   MCV 89.4 80.0 - 100.0 fL   MCH 29.5 26.0 - 34.0 pg   MCHC 33.0 30.0 - 36.0 g/dL   RDW 13.2 11.5 - 15.5 %   Platelets 94 (L) 150 - 400 K/uL    Comment: Immature Platelet Fraction may be clinically indicated, consider ordering this additional test [id number]    nRBC 0.0 0.0 - 0.2 %    Comment: Performed at [facility], [address] [place], [place] [address]  Creatinine, serum     Status: None   Collection Time: 07/17/19  5:25 AM  Result Value Ref Range   Creatinine, Ser 0.54 0.44 - 1.00 mg/dL   GFR calc non Af Amer >60 >60 mL/min   GFR calc Af Amer >60 >60 mL/min    Comment: Performed at [facility], [address] [place], [place] [address]     Assessment:   39 y.o. [id number] postoperativeday # 1  Stable-continue postoperative and postpartum care  Increase ambulation today   Plan:  1) Mild acute blood loss anemia - hemodynamically stable and asymptomatic - prenatal vitamins with iron  2) --/--/AB POS (01/12 0535) / 1.02 (06/23 1602) / Varicella immune  3) TDAP UTD  4) Breast/Contraception-Mirena  5) Disposition-possible discharge tomorrow.  Dalia Heading, CNM

## 2019-07-17 NOTE — Anesthesia Post-op Follow-up Note (Signed)
  Anesthesia Pain Follow-up Note  Patient: Terry Henderson  Day #: 1  Date of Follow-up: 07/17/2019 Time: 7:25 AM  Last Vitals:  Vitals:   07/17/19 0305 07/17/19 0500  BP: 124/67   Pulse: 68 85  Resp: 20   Temp: 36.6 C   SpO2: 96% 93%    Level of Consciousness: alert  Pain: none   Side Effects:None  Catheter Site Exam:clean, dry, no drainage  Anti-Coag Meds (From admission, onward)   Start     Dose/Rate Route Frequency Ordered Stop   07/17/19 1000  enoxaparin (LOVENOX) injection 40 mg     40 mg Subcutaneous Every 12 hours 07/16/19 0941         Plan: D/C from anesthesia care at surgeon's request  [place], [place]

## 2019-07-18 MED ORDER — IBUPROFEN 600 MG PO TABS: 600.0000 mg | ORAL_TABLET | Freq: Four times a day (QID) | ORAL | 0 refills | Status: DC

## 2019-07-18 MED ORDER — OXYCODONE-ACETAMINOPHEN 5-325 MG PO TABS: 1.0000 | ORAL_TABLET | ORAL | 0 refills | Status: DC | PRN

## 2019-07-18 MED ORDER — FERROUS SULFATE 325 (65 FE) MG PO TABS: 325.0000 mg | ORAL_TABLET | Freq: Every day | ORAL | 3 refills | Status: DC

## 2019-07-18 NOTE — Discharge Instructions (Signed)
Discharge Instructions:   If there are any new medications, they have been ordered and will be available for pickup at the listed pharmacy on your way home from the hospital.   Call office if you have any of the following: headache, visual changes, fever >101.0 F, chills, shortness of breath, breast concerns, excessive vaginal bleeding, incision drainage or problems, leg pain or redness, depression or any other concerns. If you have vaginal discharge with an odor, let your doctor know.   It is normal to bleed for up to 6 weeks. You should not soak through more than 1 pad in 1 hour. If you have a blood clot larger than your fist with continued bleeding, call your doctor.   Activity: Do not lift > 10 lbs for 6 weeks (do not lift anything heavier than your baby). No intercourse, tampons, swimming pools, hot tubs, baths (only showers) for 6 weeks.  No driving for 1-2 weeks. Continue prenatal vitamin, especially if breastfeeding. Increase calories and fluids (water) while breastfeeding.   Your milk will come in, in the next couple of days (right now it is colostrum). You may have a slight fever when your milk comes in, but it should go away on its own.  If it does not, and rises above 101 F please call the doctor. You will also feel achy and your breasts will be firm. They will also start to leak. If you are breastfeeding, continue as you have been and you can pump/express milk for comfort.   If you have too much milk, your breasts can become engorged, which could lead to mastitis. This is an infection of the milk ducts. It can be very painful and you will need to notify your doctor to obtain a prescription for antibiotics. You can also treat it with a shower or hot/cold compress.   For concerns about your baby, please call your pediatrician.  For breastfeeding concerns, the lactation consultant can be reached at [phone].   Postpartum blues (feelings of happy one minute and sad another minute)  are normal for the first few weeks but if it gets worse let your doctor know.   Congratulations! We enjoyed caring for you and your new bundle of joy!

## 2019-07-18 NOTE — Lactation Note (Signed)
This note was copied from a baby's chart. Lactation Consultation Note  Patient Name: Terry Henderson [id number] Date: 07/18/2019 Reason for consult: Follow-up assessment;Term  LC in to see mom and [name] before discharge. Mom continues to report that baby is breastfeeding well, no pain or discomfort, and baby appearing satisfied post feeds. Weight loss of 4%, and wet/stool diapers exceeding expectations. Reviewed breastfeeding basics, and what to expect in the days/weeks to come. Reviewed newborn feeding patterns, early hunger cues, growth spurts/cluster feeding, on demand feedings, tracking wet/stool diapers. Discussed breast fullness and management, transient nipple pain and care, and normal course of lactation. Provided signs of adequate milk transfer through wet/stool diapers, differences in feelings of breast tissue, and baby's behavior post feeds. Information given for outpatient lactation consult services and breastfeeding support within the community. Encouraged to call out for questions or support today before discharge.  Maternal Data Formula Feeding for Exclusion: No Has patient been taught Hand Expression?: Yes  Feeding Feeding Type: Breast Fed  LATCH Score                   Interventions Interventions: Breast feeding basics reviewed  Lactation Tools Discussed/Used     Consult Status Consult Status: Complete Date: 07/18/19 Follow-up type: Call as needed    [name] 07/18/2019, 9:51 AM

## 2019-07-18 NOTE — Progress Notes (Signed)
Patient discharged home with infant. Discharge instructions and prescriptions given and reviewed with patient. Patient verbalized understanding. Will be escorted out by staff.

## 2019-07-18 NOTE — Progress Notes (Signed)
Pt educated about incision care (cleaning kit given) and On-Q pump care/removal. Pt verbalized understanding.

## 2019-07-22 ENCOUNTER — Encounter: Payer: Self-pay | Admitting: Obstetrics and Gynecology

## 2019-07-22 ENCOUNTER — Telehealth: Payer: Self-pay | Admitting: Obstetrics and Gynecology

## 2019-07-22 ENCOUNTER — Other Ambulatory Visit: Payer: Self-pay

## 2019-07-22 ENCOUNTER — Ambulatory Visit (INDEPENDENT_AMBULATORY_CARE_PROVIDER_SITE_OTHER): Payer: BC Managed Care – PPO | Admitting: Obstetrics and Gynecology

## 2019-07-22 VITALS — BP 110/70 | Ht 63.0 in | Wt 219.0 lb

## 2019-07-22 DIAGNOSIS — Z98891 History of uterine scar from previous surgery: Secondary | ICD-10-CM

## 2019-07-22 DIAGNOSIS — Z09 Encounter for follow-up examination after completed treatment for conditions other than malignant neoplasm: Secondary | ICD-10-CM

## 2019-07-22 NOTE — Progress Notes (Signed)
   Postoperative Follow-up Patient presents post op from cesarean section  1 week ago.  Subjective: She denies fever, chills, nausea and vomiting. Eating a regular diet without difficulty. Pain is well-controlled  Activity: increasing slowly and appropriately. She denies issues with her incision.    Objective: BP 110/70   Ht 5\' 3"  (1.6 m)   Wt 219 lb (99.3 kg)   Breastfeeding Yes   BMI 38.79 kg/m   Constitutional: Well nourished, well developed female in no acute distress.  HEENT: normal Skin: Warm and dry.  Abdomen: s, nt, nd, +BS, uterine fundus firm at U-3 clean, dry, intact and without erythema, induration, warmth, and tenderness Extremity: no edema   Assessment: 39 y.o. s/p cesarean section progressing well  Plan: Patient has done well after surgery with no apparent complications.  I have discussed the post-operative course to date, and the expected progress moving forward.  The patient understands what complications to be concerned about.    Activity plan: increase slowly  Return in about 5 weeks (around 08/26/2019) for Six Week Postpartum, Mirena IUD insertion.  Prentice Docker, MD 07/22/2019 4:40 PM

## 2019-07-22 NOTE — Telephone Encounter (Signed)
Patient scheduled 2/22 for Mirena insertion SDJ/KP.

## 2019-07-24 NOTE — Telephone Encounter (Signed)
Noted. Will order to arrive by apt date/time. 

## 2019-08-26 ENCOUNTER — Ambulatory Visit (INDEPENDENT_AMBULATORY_CARE_PROVIDER_SITE_OTHER): Payer: BC Managed Care – PPO | Admitting: Obstetrics and Gynecology

## 2019-08-26 ENCOUNTER — Encounter: Payer: Self-pay | Admitting: Obstetrics and Gynecology

## 2019-08-26 ENCOUNTER — Other Ambulatory Visit: Payer: Self-pay

## 2019-08-26 DIAGNOSIS — Z30430 Encounter for insertion of intrauterine contraceptive device: Secondary | ICD-10-CM | POA: Diagnosis not present

## 2019-08-26 HISTORY — PX: OTHER SURGICAL HISTORY: SHX169

## 2019-08-26 MED ORDER — LEVONORGESTREL 20 MCG/24HR IU IUD: 1.0000 | INTRAUTERINE_SYSTEM | Freq: Once | INTRAUTERINE | 0 refills | Status: DC

## 2019-08-26 NOTE — Progress Notes (Signed)
Postpartum Visit   Chief Complaint  Patient presents with   Postpartum Care    C/S 1/12    History of Present Illness: Patient is a 39 y.o. [id number] presents for postpartum visit.  Date of delivery: 07/16/2019 Type of delivery: Cesarean section Episiotomy No.  Laceration: not applicable  Pregnancy or labor problems:  no Any problems since the delivery:  no  Newborn Details:  SINGLETON :  1. Baby's name: [name]. Birth weight: 3,860 grams (8 lb 8.2 oz) Maternal Details:  Breast Feeding:  yes Post partum depression/anxiety noted:  no Edinburgh Post-Partum Depression Score:  3  Date of last PAP: 08/17/2018  Normal, HPV negative   Past Medical History:  Diagnosis Date   Abnormal Pap smear of cervix    Chickenpox    Hay fever    Human papilloma virus     Past Surgical History:  Procedure Laterality Date   CESAREAN SECTION     x2   CESAREAN SECTION N/A 07/16/2019   Procedure: CESAREAN SECTION;  Surgeon: Will Bonnet, MD;  Location: [facility] ORS;  Service: Obstetrics;  Laterality: N/A;   CRYOTHERAPY     Mirena insert  08/26/2019    Prior to Admission medications   Medication Sig Start Date End Date Taking? Authorizing Provider  ferrous sulfate 325 (65 FE) MG tablet Take 1 tablet (325 mg total) by mouth daily with breakfast. 07/19/19   Malachy Mood, MD  ibuprofen (ADVIL) 600 MG tablet Take 1 tablet (600 mg total) by mouth every 6 (six) hours. 07/18/19   Malachy Mood, MD  Multiple Vitamins-Minerals (MULTIVITAMIN WITH MINERALS) tablet Take 1 tablet by mouth daily.    [provider]  oxyCODONE-acetaminophen (PERCOCET/ROXICET) 5-325 MG tablet Take 1 tablet by mouth every 4 (four) hours as needed for moderate pain (pain score 4-7/10). 07/18/19   Malachy Mood, MD    No Known Allergies   Social History   Socioeconomic History   Marital status: Married    Spouse name: Not on file   Number of children: Not on file   Years of education: Not  on file   Highest education level: Not on file  Occupational History   Not on file  Tobacco Use   Smoking status: Never Smoker   Smokeless tobacco: Never Used  Substance and Sexual Activity   Alcohol use: No   Drug use: No   Sexual activity: Yes    Birth control/protection: I.U.D.  Other Topics Concern   Not on file  Social History Narrative   Not on file   Social Determinants of Health   Financial Resource Strain:    Difficulty of Paying Living Expenses: Not on file  Food Insecurity:    Worried About [place] in the Last Year: Not on file   Ran Out of Food in the Last Year: Not on file  Transportation Needs:    Lack of Transportation (Medical): Not on file   Lack of Transportation (Non-Medical): Not on file  Physical Activity:    Days of Exercise per Week: Not on file   Minutes of Exercise per Session: Not on file  Stress:    Feeling of Stress : Not on file  Social Connections:    Frequency of Communication with Friends and Family: Not on file   Frequency of Social Gatherings with Friends and Family: Not on file   Attends Religious Services: Not on file   Active Member of Clubs or Organizations: Not on file   Attends Club  or Organization Meetings: Not on file   Marital Status: Not on file  Intimate Partner Violence:    Fear of Current or Ex-Partner: Not on file   Emotionally Abused: Not on file   Physically Abused: Not on file   Sexually Abused: Not on file    Family History  Problem Relation Age of Onset   Hypertension Mother    Heart attack Maternal Grandmother    Hyperlipidemia Maternal Grandmother    Stroke Maternal Grandmother    Hyperlipidemia Maternal Grandfather    Heart attack Maternal Grandfather    Early death Paternal Grandmother     Review of Systems  Constitutional: Negative.   HENT: Negative.   Eyes: Negative.   Respiratory: Negative.   Cardiovascular: Negative.   Gastrointestinal: Negative.     Genitourinary: Negative.   Musculoskeletal: Negative.   Skin: Negative.   Neurological: Negative.   Psychiatric/Behavioral: Negative.      Physical Exam BP 104/66    Pulse 76    Ht 5\' 3"  (1.6 m)    Wt 214 lb (97.1 kg)    Breastfeeding Yes    BMI 37.91 kg/m   Physical Exam Constitutional:      General: She is not in acute distress.    Appearance: Normal appearance. She is well-developed.  Genitourinary:     Pelvic exam was performed with patient in the lithotomy position.     Vulva, inguinal canal, urethra, bladder, vagina, uterus, right adnexa and left adnexa normal.     No posterior fourchette tenderness, injury or lesion present.     No cervical friability, lesion, bleeding or polyp.     Uterus is retroverted.  HENT:     Head: Normocephalic and atraumatic.  Eyes:     General: No scleral icterus.    Conjunctiva/sclera: Conjunctivae normal.  Cardiovascular:     Rate and Rhythm: Normal rate and regular rhythm.     Heart sounds: No murmur. No friction rub. No gallop.   Pulmonary:     Effort: Pulmonary effort is normal. No respiratory distress.     Breath sounds: Normal breath sounds. No wheezing or rales.  Abdominal:     General: Bowel sounds are normal. There is no distension.     Palpations: Abdomen is soft. There is no mass.     Tenderness: There is no abdominal tenderness. There is no guarding or rebound.     Comments: without erythema, induration, warmth, and tenderness. It is clean, dry, and intact.    Musculoskeletal:        General: Normal range of motion.     Cervical back: Normal range of motion and neck supple.  Neurological:     General: No focal deficit present.     Mental Status: She is alert and oriented to person, place, and time.     Cranial Nerves: No cranial nerve deficit.  Skin:    General: Skin is warm and dry.     Findings: No erythema.  Psychiatric:        Mood and Affect: Mood normal.        Behavior: Behavior normal.        Judgment: Judgment  normal.  Vitals reviewed. Exam conducted with a chaperone present.    IUD Insertion Procedure Note Patient identified, informed consent performed, consent signed.   Discussed risks of irregular bleeding, cramping, infection, malpositioning, expulsion or uterine perforation of the IUD (1:1000 placements)  which may require further procedure such as laparoscopy.  IUD while effective  at preventing pregnancy do not prevent transmission of sexually transmitted diseases and use of barrier methods for this purpose was discussed. Time out was performed.  Urine pregnancy test negative.  Speculum placed in the vagina.  Cervix visualized.  Cleaned with Betadine x 2.  Grasped anteriorly with a single tooth tenaculum.  Uterus sounded to 8 cm. IUD placed per manufacturer's recommendations.  Strings trimmed to 3 cm. Tenaculum was removed, good hemostasis noted.  Patient tolerated procedure well.   Patient was given post-procedure instructions.  She was advised to have backup contraception for one week.  Patient was also asked to check IUD strings periodically and follow up in 4 weeks for IUD check.    Female Chaperone present during breast and/or pelvic exam.  Assessment: 39 y.o. [phone] presenting for 6 week postpartum visit  Plan: Problem List Items Addressed This Visit    None    Visit Diagnoses    Postpartum care and examination    -  Primary   Relevant Medications   levonorgestrel (MIRENA) 20 MCG/24HR IUD   Encounter for IUD insertion       Relevant Medications   levonorgestrel (MIRENA) 20 MCG/24HR IUD       1) Contraception Education given regarding options for contraception, including IUD placement.  2)  Pap - ASCCP guidelines and rational discussed.  Patient opts for routine screening interval  3) Patient underwent screening for postpartum depression with no concerns noted.  4) Follow up 4 weeks for IUD string check  Prentice Docker, MD 08/26/2019 9:07 AM

## 2019-08-27 NOTE — Telephone Encounter (Signed)
Mirena rcvd/charged 08/26/2019

## 2019-09-23 ENCOUNTER — Ambulatory Visit (INDEPENDENT_AMBULATORY_CARE_PROVIDER_SITE_OTHER): Payer: BC Managed Care – PPO | Admitting: Obstetrics and Gynecology

## 2019-09-23 ENCOUNTER — Other Ambulatory Visit: Payer: Self-pay

## 2019-09-23 ENCOUNTER — Encounter: Payer: Self-pay | Admitting: Obstetrics and Gynecology

## 2019-09-23 VITALS — BP 118/74 | Ht 63.0 in | Wt 217.0 lb

## 2019-09-23 DIAGNOSIS — Z30431 Encounter for routine checking of intrauterine contraceptive device: Secondary | ICD-10-CM

## 2019-09-23 NOTE — Progress Notes (Signed)
   IUD String Check  Subjctive: [name] presents for IUD string check.  She had a Mirena placed 4 weeks ago.  Since placement of her IUD she has not had vaginal bleeding.  She denies cramping or discomfort.  She has had intercourse since placement.  She has not checked the strings.  She denies any fever, chills, nausea, vomiting, or other complaints.    Objective: BP 118/74   Ht 5\' 3"  (1.6 m)   Wt 217 lb (98.4 kg)   BMI 38.44 kg/m  Physical Exam Constitutional:      General: She is not in acute distress.    Appearance: Normal appearance.  Genitourinary:     Pelvic exam was performed with patient in the lithotomy position.     Vulva, inguinal canal, urethra, vagina, cervix, uterus, right adnexa and left adnexa normal.     IUD strings visualized.     Uterus is anteverted and regular.  HENT:     Head: Normocephalic and atraumatic.  Eyes:     General: No scleral icterus.    Conjunctiva/sclera: Conjunctivae normal.  Abdominal:     General: There is no distension.     Palpations: Abdomen is soft. There is no mass.     Tenderness: There is no abdominal tenderness. There is no guarding or rebound.  Neurological:     General: No focal deficit present.     Mental Status: She is alert and oriented to person, place, and time.     Cranial Nerves: No cranial nerve deficit.  Psychiatric:        Mood and Affect: Mood normal.        Behavior: Behavior normal.        Judgment: Judgment normal.    Female chaperone was present for the entirety of the pelvic exam  Assessment: 39 y.o. year old female status post prior Mirena IUD placement 4 week ago, doing well.  Plan: 1.  The patient was given instructions to check her IUD strings monthly and call with any problems or concerns.  She should call for fevers, chills, abnormal vaginal discharge, pelvic pain, or other complaints. 2.  She will return for a annual exam in 1 year.  All questions answered.  A total of 20 minutes were  spent face-to-face with the patient as well as preparation, review, communication, and documentation during this encounter.   Risks and benefits of IUD discussed including the risks of irregular bleeding, cramping, infection, malpositioning, expulsion, which may require further procedures such as laparoscopy.  IUDs while effective at preventing pregnancy do not prevent transmission of sexually transmitted diseases and use of barrier methods for this purpose was discussed.  Low overall incidence of failure with 99.7% efficacy rate in typical use.    Prentice Docker, MD 09/23/2019 8:11 AM

## 2019-11-21 ENCOUNTER — Ambulatory Visit: Payer: Self-pay | Attending: Internal Medicine

## 2019-11-21 DIAGNOSIS — Z23 Encounter for immunization: Secondary | ICD-10-CM

## 2019-11-21 NOTE — Progress Notes (Signed)
   Covid-19 Vaccination Clinic  Name:  Danila Rackham    MRN: [id number] DOB: [birth year]  11/21/2019  Ms. Eudy was observed post Covid-19 immunization for 15 minutes without incident. She was provided with Vaccine Information Sheet and instruction to access the V-Safe system.   Ms. Yauch was instructed to call 911 with any severe reactions post vaccine: Marland Kitchen Difficulty breathing  . Swelling of face and throat  . A fast heartbeat  . A bad rash all over body  . Dizziness and weakness   Immunizations Administered    Name Date Dose VIS Date Route   Pfizer COVID-19 Vaccine 11/21/2019  4:35 PM 0.3 mL 08/28/2018 Intramuscular   Manufacturer: [employer]   Lot: [id number]   [place]: [id number]

## 2019-12-12 ENCOUNTER — Ambulatory Visit: Payer: Self-pay | Attending: Internal Medicine

## 2019-12-12 DIAGNOSIS — Z23 Encounter for immunization: Secondary | ICD-10-CM

## 2019-12-12 NOTE — Progress Notes (Signed)
   Covid-19 Vaccination Clinic  Name:  Terry Henderson    MRN: [id number] DOB: Dec 02, 1980  12/12/2019  Ms. [name] was observed post Covid-19 immunization for 15 minutes without incident. She was provided with Vaccine Information Sheet and instruction to access the V-Safe system.   Ms. [name] was instructed to call 911 with any severe reactions post vaccine: Marland Kitchen Difficulty breathing  . Swelling of face and throat  . A fast heartbeat  . A bad rash all over body  . Dizziness and weakness   Immunizations Administered    Name Date Dose VIS Date Route   Pfizer COVID-19 Vaccine 12/12/2019  4:23 PM 0.3 mL 08/28/2018 Intramuscular   Manufacturer: [employer]   Lot: [id number]   [place]: [id number]

## 2020-02-24 LAB — RPR: RPR Ser Ql: NONREACTIVE — AB

## 2020-03-05 ENCOUNTER — Other Ambulatory Visit: Payer: Self-pay

## 2020-03-05 ENCOUNTER — Ambulatory Visit: Payer: BC Managed Care – PPO | Admitting: Nurse Practitioner

## 2020-03-05 ENCOUNTER — Encounter: Payer: Self-pay | Admitting: Nurse Practitioner

## 2020-03-05 VITALS — BP 110/72 | HR 82 | Temp 98.1°F | Ht 63.0 in | Wt 217.0 lb

## 2020-03-05 DIAGNOSIS — E669 Obesity, unspecified: Secondary | ICD-10-CM | POA: Diagnosis not present

## 2020-03-05 DIAGNOSIS — D171 Benign lipomatous neoplasm of skin and subcutaneous tissue of trunk: Secondary | ICD-10-CM | POA: Insufficient documentation

## 2020-03-05 DIAGNOSIS — Z23 Encounter for immunization: Secondary | ICD-10-CM | POA: Diagnosis not present

## 2020-03-05 DIAGNOSIS — Z0000 Encounter for general adult medical examination without abnormal findings: Secondary | ICD-10-CM

## 2020-03-05 DIAGNOSIS — Z6838 Body mass index (BMI) 38.0-38.9, adult: Secondary | ICD-10-CM | POA: Insufficient documentation

## 2020-03-05 LAB — LIPID PANEL
Cholesterol: 191 mg/dL (ref 0–200)
HDL: 44 mg/dL (ref >39.00)
LDL Cholesterol: 125 mg/dL — ABNORMAL HIGH (ref 0–99)
NonHDL: 147.22
Total CHOL/HDL Ratio: 4
Triglycerides: 110 mg/dL (ref 0.0–149.0)
VLDL: 22 mg/dL (ref 0.0–40.0)

## 2020-03-05 LAB — CBC WITH DIFFERENTIAL/PLATELET
Basophils Absolute: 0 10*3/uL (ref 0.0–0.1)
Basophils Relative: 0.5 % (ref 0.0–3.0)
Eosinophils Absolute: 0.1 10*3/uL (ref 0.0–0.7)
Eosinophils Relative: 1.2 % (ref 0.0–5.0)
HCT: 42.3 % (ref 36.0–46.0)
Hemoglobin: 14.3 g/dL (ref 12.0–15.0)
Lymphocytes Relative: 26.6 % (ref 12.0–46.0)
Lymphs Abs: 1.7 10*3/uL (ref 0.7–4.0)
MCHC: 33.8 g/dL (ref 30.0–36.0)
MCV: 88.4 fl (ref 78.0–100.0)
Monocytes Absolute: 0.3 10*3/uL (ref 0.1–1.0)
Monocytes Relative: 4 % (ref 3.0–12.0)
Neutro Abs: 4.3 10*3/uL (ref 1.4–7.7)
Neutrophils Relative %: 67.7 % (ref 43.0–77.0)
Platelets: 278 10*3/uL (ref 150.0–400.0)
RBC: 4.79 Mil/uL (ref 3.87–5.11)
RDW: 12.7 % (ref 11.5–15.5)
WBC: 6.3 10*3/uL (ref 4.0–10.5)

## 2020-03-05 LAB — COMPREHENSIVE METABOLIC PANEL
ALT: 15 U/L (ref 0–35)
AST: 15 U/L (ref 0–37)
Albumin: 4.5 g/dL (ref 3.5–5.2)
Alkaline Phosphatase: 65 U/L (ref 39–117)
BUN: 13 mg/dL (ref 6–23)
CO2: 29 mEq/L (ref 19–32)
Calcium: 9.3 mg/dL (ref 8.4–10.5)
Chloride: 101 mEq/L (ref 96–112)
Creatinine, Ser: 0.73 mg/dL (ref 0.40–1.20)
GFR: 88.56 mL/min (ref >60.00)
Glucose, Bld: 91 mg/dL (ref 70–99)
Potassium: 4.2 mEq/L (ref 3.5–5.1)
Sodium: 137 mEq/L (ref 135–145)
Total Bilirubin: 0.4 mg/dL (ref 0.2–1.2)
Total Protein: 7.1 g/dL (ref 6.0–8.3)

## 2020-03-05 LAB — HEMOGLOBIN A1C: Hgb A1c MFr Bld: 5.7 % (ref 4.6–6.5)

## 2020-03-05 LAB — B12 AND FOLATE PANEL
Folate: >24.8 ng/mL (ref >5.9)
Vitamin B-12: 521 pg/mL (ref 211–911)

## 2020-03-05 LAB — TSH: TSH: 1.36 u[IU]/mL (ref 0.35–4.50)

## 2020-03-05 LAB — VITAMIN D 25 HYDROXY (VIT D DEFICIENCY, FRACTURES): VITD: 34.3 ng/mL (ref 30.00–100.00)

## 2020-03-05 NOTE — Progress Notes (Signed)
Established Patient Office Visit  Subjective:  Patient ID: Terry Henderson, female    DOB: 12-03-1980  Age: 39 y.o. MRN: [id number]  CC:  Chief Complaint  Patient presents with  . Transitions Of Care    HPI Terry Henderson is a 39 year old who presents for routine health care.  She has a lipoma on her back and she would like to get a referral to have that removed.  Patient had a baby July 16, 2019 and is no longer lactating.  She reports she is doing well overall.  She has 3 young children.    BMI 38.44/obesity: She is losing her baby weight.  She says she is lost all but 5 pounds of her baby weight.  Still working on this.  Hyperlipidemia: Treating with diet and exercise.  Patient presents today for complete physical.  Immunizations: Pfizer Covid vaccines are up-to-date.  Tdap is up-to-date.  She will need a flu shot this year. Diet:Heathy diet and attempting active weight loss.  Exercise: Walking  Pap Smear: UTD. IUD  Mammogram: not yet- no FH breast cancer Dental: UTD Vision: Not up-to-date.  I recommended exam today    Past Medical History:  Diagnosis Date  . Abnormal Pap smear of cervix   . Chickenpox   . Hay fever   . Human papilloma virus     Past Surgical History:  Procedure Laterality Date  . CESAREAN SECTION     x2  . CESAREAN SECTION N/A 07/16/2019   Procedure: CESAREAN SECTION;  Surgeon: Will Bonnet, MD;  Location: [facility] ORS;  Service: Obstetrics;  Laterality: N/A;  . CRYOTHERAPY    . Mirena insert  08/26/2019    Family History  Problem Relation Age of Onset  . Hypertension Mother   . Heart attack Maternal Grandmother   . Hyperlipidemia Maternal Grandmother   . Stroke Maternal Grandmother   . Hyperlipidemia Maternal Grandfather   . Heart attack Maternal Grandfather   . Early death Paternal Grandmother     Social History   Socioeconomic History  . Marital status: Married    Spouse name: Not on file  . Number of children: Not on file  .  Years of education: Not on file  . Highest education level: Not on file  Occupational History  . Not on file  Tobacco Use  . Smoking status: Never Smoker  . Smokeless tobacco: Never Used  Vaping Use  . Vaping Use: Never used  Substance and Sexual Activity  . Alcohol use: No  . Drug use: No  . Sexual activity: Yes    Birth control/protection: I.U.D.  Other Topics Concern  . Not on file  Social History Narrative  . Not on file   Social Determinants of Health   Financial Resource Strain:   . Difficulty of Paying Living Expenses: Not on file  Food Insecurity:   . Worried About [employer] fundraiser in the Last Year: Not on file  . Ran Out of Food in the Last Year: Not on file  Transportation Needs:   . Lack of Transportation (Medical): Not on file  . Lack of Transportation (Non-Medical): Not on file  Physical Activity:   . Days of Exercise per Week: Not on file  . Minutes of Exercise per Session: Not on file  Stress:   . Feeling of Stress : Not on file  Social Connections:   . Frequency of Communication with Friends and Family: Not on file  . Frequency of Social Gatherings with Friends  and Family: Not on file  . Attends Religious Services: Not on file  . Active Member of Clubs or Organizations: Not on file  . Attends Archivist Meetings: Not on file  . Marital Status: Not on file  Intimate Partner Violence:   . Fear of Current or Ex-Partner: Not on file  . Emotionally Abused: Not on file  . Physically Abused: Not on file  . Sexually Abused: Not on file    Outpatient Medications Prior to Visit  Medication Sig Dispense Refill  . Multiple Vitamins-Minerals (MULTIVITAMIN WITH MINERALS) tablet Take 1 tablet by mouth daily.    Marland Kitchen levonorgestrel (MIRENA) 20 MCG/24HR IUD 1 Intra Uterine Device (1 each total) by Intrauterine route once for 1 dose. 1 each 0   No facility-administered medications prior to visit.    No Known Allergies  Review of Systems    Constitutional: Negative.   HENT: Negative.   Eyes: Negative.   Respiratory: Negative.   Cardiovascular: Negative.   Gastrointestinal: Negative.   Endocrine: Negative.   Genitourinary: Negative.   Musculoskeletal: Negative.   Allergic/Immunologic: Negative.   Neurological: Negative.   Hematological: Negative.   Psychiatric/Behavioral:       No depression/anxiety concerns      Objective:    Physical Exam Vitals reviewed.  Constitutional:      Appearance: She is obese.  HENT:     Head: Normocephalic and atraumatic.  Eyes:     Conjunctiva/sclera: Conjunctivae normal.     Pupils: Pupils are equal, round, and reactive to light.  Cardiovascular:     Rate and Rhythm: Normal rate and regular rhythm.     Pulses: Normal pulses.     Heart sounds: Normal heart sounds.  Pulmonary:     Effort: Pulmonary effort is normal.     Breath sounds: Normal breath sounds.  Abdominal:     Palpations: Abdomen is soft.     Tenderness: There is no abdominal tenderness.     Comments: Small diastasis recti  Musculoskeletal:        General: Normal range of motion.     Cervical back: Normal range of motion and neck supple.     Comments: Patient has a lipoma previously evaluated by ultrasound on her lower back.  Skin:    General: Skin is warm and dry.     Comments: She had a C-section, and still has numbness at the incision site.  Neurological:     General: No focal deficit present.     Mental Status: She is alert and oriented to person, place, and time.  Psychiatric:        Mood and Affect: Mood normal.        Behavior: Behavior normal.        Thought Content: Thought content normal.        Judgment: Judgment normal.     Comments: No concerns about depression or anxiety.    BP 110/72 (BP Location: Left Arm, Patient Position: Sitting, Cuff Size: Normal)   Pulse 82   Temp 98.1 F (36.7 C) (Oral)   Ht 5\' 3"  (1.6 m)   Wt 217 lb (98.4 kg)   SpO2 98%   BMI 38.44 kg/m  Wt Readings from  Last 3 Encounters:  03/05/20 217 lb (98.4 kg)  09/23/19 217 lb (98.4 kg)  08/26/19 214 lb (97.1 kg)   Pulse Readings from Last 3 Encounters:  03/05/20 82  08/26/19 76  07/18/19 78    BP Readings from Last 3  Encounters:  03/05/20 110/72  09/23/19 118/74  08/26/19 104/66    Lab Results  Component Value Date   CHOL 199 08/14/2018   HDL 41.50 08/14/2018   LDLCALC 134 (H) 08/14/2018   TRIG 115.0 08/14/2018   CHOLHDL 5 08/14/2018      Health Maintenance Due  Topic Date Due  . Hepatitis C Screening  Never done  . INFLUENZA VACCINE  02/02/2020    There are no preventive care reminders to display for this patient.  Lab Results  Component Value Date   TSH 1.30 08/14/2018   Lab Results  Component Value Date   WBC 9.1 07/17/2019   HGB 10.0 (L) 07/17/2019   HCT 30.3 (L) 07/17/2019   MCV 89.4 07/17/2019   PLT 94 (L) 07/17/2019   Lab Results  Component Value Date   NA 138 08/14/2018   K 4.4 08/14/2018   CO2 25 08/14/2018   GLUCOSE 100 (H) 08/14/2018   BUN 17 08/14/2018   CREATININE 0.54 07/17/2019   BILITOT 0.4 08/14/2018   ALKPHOS 54 08/14/2018   AST 16 08/14/2018   ALT 17 08/14/2018   PROT 7.1 08/14/2018   ALBUMIN 4.4 08/14/2018   CALCIUM 9.4 08/14/2018   GFR 95.28 08/14/2018   Lab Results  Component Value Date   CHOL 199 08/14/2018   Lab Results  Component Value Date   HDL 41.50 08/14/2018   Lab Results  Component Value Date   LDLCALC 134 (H) 08/14/2018   Lab Results  Component Value Date   TRIG 115.0 08/14/2018   Lab Results  Component Value Date   CHOLHDL 5 08/14/2018   No results found for: HGBA1C    Assessment & Plan:   Problem List Items Addressed This Visit      Other   Encounter for preventive care - Primary   Relevant Orders   CBC with Differential/Platelet   Hepatitis C antibody   VITAMIN D 25 Hydroxy (Vit-D Deficiency, Fractures)   B12 and Folate Panel   Lipoma of torso   Relevant Orders   Ambulatory referral to General  Surgery   Obesity, Class II, BMI 35-39.9, isolated (see actual BMI)   Relevant Orders   TSH   Comprehensive metabolic panel   Hemoglobin A1c   Lipid panel   BMI 38.0-38.9,adult      No orders of the defined types were placed in this encounter. Please go to the lab today for routine blood work.  We will call you with results when they are all complete.  I placed referral to Dr. Dahlia Byes, general surgery to take a look at your back lipoma.  Continue to work on [employer], weight loss, exercise.  Recommend eye exam this year.    Recommend flu shot.  Your immunizations are otherwise up-to-date.  Talk to her gynecologist about when he suggests her first mammogram.    Follow-up: No follow-ups on file.  This visit occurred during the SARS-CoV-2 public health emergency.  Safety protocols were in place, including screening questions prior to the visit, additional usage of staff PPE, and extensive cleaning of exam room while observing appropriate contact time as indicated for disinfecting solutions.    Denice Paradise, NP

## 2020-03-05 NOTE — Patient Instructions (Addendum)
Please go to the lab today for routine blood work.  We will call you with results when they are all complete.  I placed referral to Dr. Dahlia Byes, general surgery to take a look at your back lipoma.  Continue to work on [employer], weight loss, exercise.  Recommend eye exam this year.    Recommend flu shot.  Your immunizations are otherwise up-to-date.  Talk to her gynecologist about when he suggests her first mammogram.      Preventive Care 42-39 Years Old, Female Preventive care refers to visits with your health care provider and lifestyle choices that can promote health and wellness. This includes:  A yearly physical exam. This may also be called an annual well check.  Regular dental visits and eye exams.  Immunizations.  Screening for certain conditions.  Healthy lifestyle choices, such as eating a healthy diet, getting regular exercise, not using drugs or products that contain nicotine and tobacco, and limiting alcohol use. What can I expect for my preventive care visit? Physical exam Your health care provider will check your:  Height and weight. This may be used to calculate body mass index (BMI), which tells if you are at a healthy weight.  Heart rate and blood pressure.  Skin for abnormal spots. Counseling Your health care provider may ask you questions about your:  Alcohol, tobacco, and drug use.  Emotional well-being.  Home and relationship well-being.  Sexual activity.  Eating habits.  Work and work Statistician.  Method of birth control.  Menstrual cycle.  Pregnancy history. What immunizations do I need?  Influenza (flu) vaccine  This is recommended every year. Tetanus, diphtheria, and pertussis (Tdap) vaccine  You may need a Td booster every 10 years. Varicella (chickenpox) vaccine  You may need this if you have not been vaccinated. Human papillomavirus (HPV) vaccine  If recommended by your health care provider, you may need three doses  over 6 months. Measles, mumps, and rubella (MMR) vaccine  You may need at least one dose of MMR. You may also need a second dose. Meningococcal conjugate (MenACWY) vaccine  One dose is recommended if you are age 32-21 years and a first-year college student living in a residence hall, or if you have one of several medical conditions. You may also need additional booster doses. Pneumococcal conjugate (PCV13) vaccine  You may need this if you have certain conditions and were not previously vaccinated. Pneumococcal polysaccharide (PPSV23) vaccine  You may need one or two doses if you smoke cigarettes or if you have certain conditions. Hepatitis A vaccine  You may need this if you have certain conditions or if you travel or work in places where you may be exposed to hepatitis A. Hepatitis B vaccine  You may need this if you have certain conditions or if you travel or work in places where you may be exposed to hepatitis B. Haemophilus influenzae type b (Hib) vaccine  You may need this if you have certain conditions. You may receive vaccines as individual doses or as more than one vaccine together in one shot (combination vaccines). Talk with your health care provider about the risks and benefits of combination vaccines. What tests do I need?  Blood tests  Lipid and cholesterol levels. These may be checked every 5 years starting at age 83.  Hepatitis C test.  Hepatitis B test. Screening  Diabetes screening. This is done by checking your blood sugar (glucose) after you have not eaten for a while (fasting).  Sexually transmitted  disease (STD) testing.  BRCA-related cancer screening. This may be done if you have a family history of breast, ovarian, tubal, or peritoneal cancers.  Pelvic exam and Pap test. This may be done every 3 years starting at age 36. Starting at age 10, this may be done every 5 years if you have a Pap test in combination with an HPV test. Talk with your health care  provider about your test results, treatment options, and if necessary, the need for more tests. Follow these instructions at home: Eating and drinking   Eat a diet that includes fresh fruits and vegetables, whole grains, lean protein, and low-fat dairy.  Take vitamin and mineral supplements as recommended by your health care provider.  Do not drink alcohol if: ? Your health care provider tells you not to drink. ? You are pregnant, may be pregnant, or are planning to become pregnant.  If you drink alcohol: ? Limit how much you have to 0-1 drink a day. ? Be aware of how much alcohol is in your drink. In the [place] one drink equals one 12 oz bottle of beer (355 mL), one 5 oz glass of wine (148 mL), or one 1 oz glass of hard liquor (44 mL). Lifestyle  Take daily care of your teeth and gums.  Stay active. Exercise for at least 30 minutes on 5 or more days each week.  Do not use any products that contain nicotine or tobacco, such as cigarettes, e-cigarettes, and chewing tobacco. If you need help quitting, ask your health care provider.  If you are sexually active, practice safe sex. Use a condom or other form of birth control (contraception) in order to prevent pregnancy and STIs (sexually transmitted infections). If you plan to become pregnant, see your health care provider for a preconception visit. What's next?  Visit your health care provider once a year for a well check visit.  Ask your health care provider how often you should have your eyes and teeth checked.  Stay up to date on all vaccines. This information is not intended to replace advice given to you by your health care provider. Make sure you discuss any questions you have with your health care provider. Document Revised: 03/01/2018 Document Reviewed: 03/01/2018 Elsevier Patient Education  [place].  Lipoma Removal  Lipoma removal is a surgical procedure to remove a lipoma, which is a noncancerous (benign) tumor  that is made up of fat cells. Most lipomas are small and painless and do not require treatment. They can form in many areas of the body but are most common under the skin of the back, arms, shoulders, buttocks, and thighs. You may need lipoma removal if you have a lipoma that is large, growing, or causing discomfort. Lipoma removal may also be done for cosmetic reasons. Tell a health care provider about:  Any allergies you have.  All medicines you are taking, including vitamins, herbs, eye drops, creams, and over-the-counter medicines.  Any problems you or family members have had with anesthetic medicines.  Any blood disorders you have.  Any surgeries you have had.  Any medical conditions you have.  Whether you are pregnant or may be pregnant. What are the risks? Generally, this is a safe procedure. However, problems may occur, including:  Infection.  Bleeding.  Scarring.  Allergic reactions to medicines.  Damage to nearby structures or organs, such as damage to nerves or blood vessels near the lipoma. What happens before the procedure? Staying hydrated Follow instructions from  your health care provider about hydration, which may include:  Up to 2 hours before the procedure - you may continue to drink clear liquids, such as water, clear fruit juice, black coffee, and plain tea. Eating and drinking restrictions Follow instructions from your health care provider about eating and drinking, which may include:  8 hours before the procedure - stop eating heavy meals or foods, such as meat, fried foods, or fatty foods.  6 hours before the procedure - stop eating light meals or foods, such as toast or cereal.  6 hours before the procedure - stop drinking milk or drinks that contain milk.  2 hours before the procedure - stop drinking clear liquids. Medicines Ask your health care provider about:  Changing or stopping your regular medicines. This is especially important if you are  taking diabetes medicines or blood thinners.  Taking medicines such as aspirin and ibuprofen. These medicines can thin your blood. Do not take these medicines unless your health care provider tells you to take them.  Taking over-the-counter medicines, vitamins, herbs, and supplements. General instructions  You will have a physical exam. Your health care provider will check the size of the lipoma and whether it can be moved easily.  You may have a biopsy and imaging tests, such as X-rays, a CT scan, and an MRI.  Do not use any products that contain nicotine or tobacco for at least 4 weeks before the procedure. These products include cigarettes, e-cigarettes, and chewing tobacco. If you need help quitting, ask your health care provider.  Ask your health care provider: ? How your surgery site will be marked. ? What steps will be taken to help prevent infection. These may include:  Washing skin with a germ-killing soap.  Taking antibiotic medicine.  Plan to have someone take you home from the hospital or clinic.  If you will be going home right after the procedure, plan to have someone with you for 24 hours. What happens during the procedure?   An IV will be inserted into one of your veins.  You will be given one or more of the following: ? A medicine to help you relax (sedative). ? A medicine to numb the area (local anesthetic). ? A medicine to make you fall asleep (general anesthetic). ? A medicine that is injected into an area of your body to numb everything below the injection site (regional anesthetic).  An incision will be made over the lipoma or very near the lipoma. The incision may be made in a natural skin line or crease.  Tissues, nerves, and blood vessels near the lipoma will be moved out of the way.  The lipoma and the capsule that surrounds it will be separated from the surrounding tissues.  The lipoma will be removed.  The incision may be closed with stitches  (sutures).  A bandage (dressing) will be placed over the incision. The procedure may vary among health care providers and hospitals. What happens after the procedure?  Your blood pressure, heart rate, breathing rate, and blood oxygen level will be monitored until you leave the hospital or clinic.  If you were prescribed an antibiotic medicine, use it as told by your health care provider. Do not stop using the antibiotic even if you start to feel better.  If you were given a sedative during the procedure, it can affect you for several hours. Do not drive or operate machinery until your health care provider says that it is safe. Summary  Before  the procedure, follow instructions from your health care provider about eating and drinking, and changing or stopping your regular medicines. This is especially important if you are taking diabetes medicines or blood thinners.  After the lipoma is removed, the incision may be closed with stitches (sutures) and covered with a bandage (dressing).  If you were given a sedative during the procedure, it can affect you for several hours. Do not drive or operate machinery until your health care provider says that it is safe. This information is not intended to replace advice given to you by your health care provider. Make sure you discuss any questions you have with your health care provider. Document Revised: 02/04/2019 Document Reviewed: 02/04/2019 Elsevier Patient Education  [place].

## 2020-03-06 LAB — HEPATITIS C ANTIBODY
Hepatitis C Ab: NONREACTIVE
SIGNAL TO CUT-OFF: 0.13 (ref <1.00)

## 2020-03-11 ENCOUNTER — Telehealth: Payer: Self-pay

## 2020-03-11 NOTE — Telephone Encounter (Signed)
Patient was returning call for lab results 

## 2020-03-11 NOTE — Telephone Encounter (Signed)
Patient aware of lab results.

## 2020-03-11 NOTE — Telephone Encounter (Signed)
LMTCB for lab results.  

## 2020-03-12 ENCOUNTER — Ambulatory Visit: Payer: BC Managed Care – PPO | Admitting: General Surgery

## 2020-03-19 ENCOUNTER — Encounter: Payer: Self-pay | Admitting: General Surgery

## 2020-03-19 ENCOUNTER — Telehealth: Payer: Self-pay | Admitting: General Surgery

## 2020-03-19 ENCOUNTER — Ambulatory Visit (INDEPENDENT_AMBULATORY_CARE_PROVIDER_SITE_OTHER): Payer: BC Managed Care – PPO | Admitting: General Surgery

## 2020-03-19 ENCOUNTER — Other Ambulatory Visit: Payer: Self-pay

## 2020-03-19 VITALS — BP 121/79 | HR 75 | Temp 98.1°F | Ht 66.0 in | Wt 218.0 lb

## 2020-03-19 DIAGNOSIS — D171 Benign lipomatous neoplasm of skin and subcutaneous tissue of trunk: Secondary | ICD-10-CM | POA: Diagnosis not present

## 2020-03-19 NOTE — Progress Notes (Signed)
Patient ID: Terry Henderson, female   DOB: 01-06-1981, 39 y.o.   MRN: [id number]  Chief Complaint  Patient presents with   New Patient (Initial Visit)    new pt ref Terry Paradise NP back lipoma    HPI Terry Henderson is a 39 y.o. female.  ;c She has been referred by her primary care provider for further evaluation of a soft tissue mass on her back.  She states that it has been present for just over a year.  She says that it initially started out roughly the size of a quarter.  Over the intervening time period, it has grown somewhat.  She experiences pressure in the area of the mass.  It is not painful and the pressure is isolated to the location of the mass.  It is adjacent to the spine but not directly overlying it.  She has never had a similar mass in the past and has only the 1 at this time.  It is never become hot or red.  There is never been any drainage from the site.  She denies any fevers or chills, no nausea or vomiting.  She is interested in having removed due to the pressure and growth.   Past Medical History:  Diagnosis Date   Abnormal Pap smear of cervix    Chickenpox    Hay fever    Human papilloma virus     Past Surgical History:  Procedure Laterality Date   CESAREAN SECTION     x2   CESAREAN SECTION N/A 07/16/2019   Procedure: CESAREAN SECTION;  Surgeon: Will Bonnet, MD;  Location: [facility] ORS;  Service: Obstetrics;  Laterality: N/A;   CRYOTHERAPY     Mirena insert  08/26/2019    Family History  Problem Relation Age of Onset   Hypertension Mother    Heart attack Maternal Grandmother    Hyperlipidemia Maternal Grandmother    Stroke Maternal Grandmother    Hyperlipidemia Maternal Grandfather    Heart attack Maternal Grandfather    Early death Paternal Grandmother     Social History Social History   Tobacco Use   Smoking status: Never Smoker   Smokeless tobacco: Never Used  Scientific laboratory technician Use: Never used  Substance Use Topics    Alcohol use: No   Drug use: No    No Known Allergies  Current Outpatient Medications  Medication Sig Dispense Refill   Multiple Vitamins-Minerals (MULTIVITAMIN WITH MINERALS) tablet Take 1 tablet by mouth daily.     No current facility-administered medications for this visit.    Review of Systems Review of Systems  All other systems reviewed and are negative.   Blood pressure 121/79, pulse 75, temperature 98.1 F (36.7 C), temperature source Oral, height 5\' 6"  (1.676 m), weight 218 lb (98.9 kg), SpO2 98 %, currently breastfeeding.  Physical Exam Physical Exam Constitutional:      General: She is not in acute distress.    Appearance: Normal appearance. She is obese.  HENT:     Head: Normocephalic and atraumatic.     Nose:     Comments: Covered with a mask    Mouth/Throat:     Comments: Covered with a mask Eyes:     General: No scleral icterus.       Right eye: No discharge.        Left eye: No discharge.  Neck:     Comments: No palpable cervical or supraclavicular lymphadenopathy.  The trachea is midline.  There is  no thyromegaly or dominant thyroid mass appreciated.  The gland moves freely with deglutition. Cardiovascular:     Rate and Rhythm: Normal rate and regular rhythm.     Pulses: Normal pulses.  Pulmonary:     Effort: Pulmonary effort is normal. No respiratory distress.     Breath sounds: Normal breath sounds.  Abdominal:     General: Bowel sounds are normal.     Palpations: Abdomen is soft.  Genitourinary:    Comments: Deferred Musculoskeletal:        General: No swelling or tenderness.  Skin:    General: Skin is warm and dry.          Comments: There is a well-circumscribed soft tissue mass adjacent to the spine just caudal to the tip of the scapula.  It is soft and mobile.  Neurological:     General: No focal deficit present.     Mental Status: She is alert and oriented to person, place, and time.  Psychiatric:        Mood and Affect: Mood  normal.        Behavior: Behavior normal.     Data Reviewed An ultrasound was performed in March 2020. The results are copied here: CLINICAL DATA:  Soft tissue mass back.  Lipoma.  EXAM: ULTRASOUND OF BACK SOFT TISSUES  TECHNIQUE: Ultrasound examination of the head and neck soft tissues was performed in the area of clinical concern.  COMPARISON:  None.  FINDINGS: Scanning in the upper to mid back slightly to the right of midline. Echogenic thickening of the paraspinous muscles is ill-defined and difficult to measure. Estimate is approximately 15 x 20 mm. No cystic mass identified.  IMPRESSION: Soft tissue thickening right paraspinous mass may represent lipoma however is inconclusive on ultrasound. If the mass is symptomatic or enlarging, further imaging is suggested. MRI thoracic spine with contrast would be most diagnostic. CT chest without contrast could also be performed.  I personally reviewed the imaging and to my eye, the area does appear consistent with a lipoma.  Results for Terry Henderson, Terry Henderson (MRN [id number]) as of 03/19/2020 10:22  Ref. Range 03/05/2020 13:34  Sodium Latest Ref Range: 135 - 145 mEq/L 137  Potassium Latest Ref Range: 3.5 - 5.1 mEq/L 4.2  Chloride Latest Ref Range: 96 - 112 mEq/L 101  CO2 Latest Ref Range: 19 - 32 mEq/L 29  Glucose Latest Ref Range: 70 - 99 mg/dL 91  BUN Latest Ref Range: 6 - 23 mg/dL 13  Creatinine Latest Ref Range: 0.40 - 1.20 mg/dL 0.73  Calcium Latest Ref Range: 8.4 - 10.5 mg/dL 9.3  Alkaline Phosphatase Latest Ref Range: 39 - 117 U/L 65  Albumin Latest Ref Range: 3.5 - 5.2 g/dL 4.5  AST Latest Ref Range: 0 - 37 U/L 15  ALT Latest Ref Range: 0 - 35 U/L 15  Total Protein Latest Ref Range: 6.0 - 8.3 g/dL 7.1  Total Bilirubin Latest Ref Range: 0.2 - 1.2 mg/dL 0.4  GFR Latest Ref Range: >60.00 mL/min 88.56  Total CHOL/HDL Ratio Unknown 4  Cholesterol Latest Ref Range: 0 - 200 mg/dL 191  HDL Cholesterol Latest Ref Range:  >39.00 mg/dL 44.00  LDL (calc) Latest Ref Range: 0 - 99 mg/dL 125 (H)  NonHDL Unknown 147.22  Triglycerides Latest Ref Range: 0 - 149 mg/dL 110.0  VLDL Latest Ref Range: 0.0 - 40.0 mg/dL 22.0  Folate Latest Ref Range: >5.9 ng/mL >24.8  VITD Latest Ref Range: 30.00 - 100.00 ng/mL 34.30  Vitamin  B12 Latest Ref Range: 211 - 911 pg/mL 521  WBC Latest Ref Range: 4.0 - 10.5 K/uL 6.3  RBC Latest Ref Range: 3.87 - 5.11 Mil/uL 4.79  Hemoglobin Latest Ref Range: 12.0 - 15.0 g/dL 14.3  HCT Latest Ref Range: 36 - 46 % 42.3  MCV Latest Ref Range: 78.0 - 100.0 fl 88.4  MCHC Latest Ref Range: 30.0 - 36.0 g/dL 33.8  RDW Latest Ref Range: 11.5 - 15.5 % 12.7  Platelets Latest Ref Range: 150 - 400 K/uL 278.0   These recent labs are all essentially within normal range with a mild elevation of her low-density lipoprotein.  I also reviewed reviewed the clinic note dated 03/05/2020 from her primary care provider.  This resulted in the referral to surgery for evaluation.  Assessment This is a 39 year old woman who has a soft tissue lesion on her back that is most consistent clinically with a lipoma.  I have offered her removal.  The risks of surgery were discussed with her.  These include, but are not limited to, bleeding, infection, recurrence, scar, pain, wound healing issues, and the risks of anesthesia.  All of her questions were answered to her satisfaction and she wishes to proceed.  Plan We will work on getting her scheduled.    Fredirick Maudlin 03/19/2020, 10:14 AM

## 2020-03-19 NOTE — Telephone Encounter (Signed)
Outgoing call is made, left message for patient to call me so that we can discuss surgery dates.

## 2020-03-19 NOTE — Patient Instructions (Addendum)
Our surgery scheduler Pamala Hurry will contact you within the next 24-48 hours. During that call, she will discuss the preparation prior to surgery and also discuss the different dates and times for surgery. Please have the BLUE sheet available when she contacts you. If you have any concerns regarding the surgery, please do not hesitate to give our office a call.   Lipoma  A lipoma is a noncancerous (benign) tumor that is made up of fat cells. This is a very common type of soft-tissue growth. Lipomas are usually found under the skin (subcutaneous). They may occur in any tissue of the body that contains fat. Common areas for lipomas to appear include the back, arms, shoulders, buttocks, and thighs. Lipomas grow slowly, and they are usually painless. Most lipomas do not cause problems and do not require treatment. What are the causes? The cause of this condition is not known. What increases the risk? You are more likely to develop this condition if:  You are 40-13 years old.  You have a family history of lipomas. What are the signs or symptoms? A lipoma usually appears as a small, round bump under the skin. In most cases, the lump will:  Feel soft or rubbery.  Not cause pain or other symptoms. However, if a lipoma is located in an area where it pushes on nerves, it can become painful or cause other symptoms. How is this diagnosed? A lipoma can usually be diagnosed with a physical exam. You may also have tests to confirm the diagnosis and to rule out other conditions. Tests may include:  Imaging tests, such as a CT scan or an MRI.  Removal of a tissue sample to be looked at under a microscope (biopsy). How is this treated? Treatment for this condition depends on the size of the lipoma and whether it is causing any symptoms.  For small lipomas that are not causing problems, no treatment is needed.  If a lipoma is bigger or it causes problems, surgery may be done to remove the lipoma. Lipomas  can also be removed to improve appearance. Most often, the procedure is done after applying a medicine that numbs the area (local anesthetic).  Liposuction may be done to reduce the size of the lipoma before it is removed through surgery, or it may be done to remove the lipoma. Lipomas are removed with this method in order to limit incision size and scarring. A liposuction tube is inserted through a small incision into the lipoma, and the contents of the lipoma are removed through the tube with suction. Follow these instructions at home:  Watch your lipoma for any changes.  Keep all follow-up visits as told by your health care provider. This is important. Contact a health care provider if:  Your lipoma becomes larger or hard.  Your lipoma becomes painful, red, or increasingly swollen. These could be signs of infection or a more serious condition. Get help right away if:  You develop tingling or numbness in an area near the lipoma. This could indicate that the lipoma is causing nerve damage. Summary  A lipoma is a noncancerous tumor that is made up of fat cells.  Most lipomas do not cause problems and do not require treatment.  If a lipoma is bigger or it causes problems, surgery may be done to remove the lipoma.  Contact a health care provider if your lipoma becomes larger or hard, or if it becomes painful, red, or increasingly swollen. Pain, redness, and swelling could be signs  of infection or a more serious condition. This information is not intended to replace advice given to you by your health care provider. Make sure you discuss any questions you have with your health care provider. Document Revised: 02/04/2019 Document Reviewed: 02/04/2019 Elsevier Patient Education  [place].

## 2020-03-19 NOTE — H&P (View-Only) (Signed)
Patient ID: Terry Henderson, female   DOB: 10/16/1980, 39 y.o.   MRN: [id number]  Chief Complaint  Patient presents with  . New Patient (Initial Visit)    new pt ref Terry Henderson back lipoma    HPI Terry Henderson is a 39 y.o. female.  ;c She has been referred by her primary care provider for further evaluation of a soft tissue mass on her back.  She states that it has been present for just over a year.  She says that it initially started out roughly the size of a quarter.  Over the intervening time period, it has grown somewhat.  She experiences pressure in the area of the mass.  It is not painful and the pressure is isolated to the location of the mass.  It is adjacent to the spine but not directly overlying it.  She has never had a similar mass in the past and has only the 1 at this time.  It is never become hot or red.  There is never been any drainage from the site.  She denies any fevers or chills, no nausea or vomiting.  She is interested in having removed due to the pressure and growth.   Past Medical History:  Diagnosis Date  . Abnormal Pap smear of cervix   . Chickenpox   . Hay fever   . Human papilloma virus     Past Surgical History:  Procedure Laterality Date  . CESAREAN SECTION     x2  . CESAREAN SECTION N/A 07/16/2019   Procedure: CESAREAN SECTION;  Surgeon: Will Bonnet, MD;  Location: [facility] ORS;  Service: Obstetrics;  Laterality: N/A;  . CRYOTHERAPY    . Mirena insert  08/26/2019    Family History  Problem Relation Age of Onset  . Hypertension Mother   . Heart attack Maternal Grandmother   . Hyperlipidemia Maternal Grandmother   . Stroke Maternal Grandmother   . Hyperlipidemia Maternal Grandfather   . Heart attack Maternal Grandfather   . Early death Paternal Grandmother     Social History Social History   Tobacco Use  . Smoking status: Never Smoker  . Smokeless tobacco: Never Used  Vaping Use  . Vaping Use: Never used  Substance Use Topics  .  Alcohol use: No  . Drug use: No    No Known Allergies  Current Outpatient Medications  Medication Sig Dispense Refill  . Multiple Vitamins-Minerals (MULTIVITAMIN WITH MINERALS) tablet Take 1 tablet by mouth daily.     No current facility-administered medications for this visit.    Review of Systems Review of Systems  All other systems reviewed and are negative.   Blood pressure 121/79, pulse 75, temperature 98.1 F (36.7 C), temperature source Oral, height 5\' 6"  (1.676 m), weight 218 lb (98.9 kg), SpO2 98 %, currently breastfeeding.  Physical Exam Physical Exam Constitutional:      General: She is not in acute distress.    Appearance: Normal appearance. She is obese.  HENT:     Head: Normocephalic and atraumatic.     Nose:     Comments: Covered with a mask    Mouth/Throat:     Comments: Covered with a mask Eyes:     General: No scleral icterus.       Right eye: No discharge.        Left eye: No discharge.  Neck:     Comments: No palpable cervical or supraclavicular lymphadenopathy.  The trachea is midline.  There is  no thyromegaly or dominant thyroid mass appreciated.  The gland moves freely with deglutition. Cardiovascular:     Rate and Rhythm: Normal rate and regular rhythm.     Pulses: Normal pulses.  Pulmonary:     Effort: Pulmonary effort is normal. No respiratory distress.     Breath sounds: Normal breath sounds.  Abdominal:     General: Bowel sounds are normal.     Palpations: Abdomen is soft.  Genitourinary:    Comments: Deferred Musculoskeletal:        General: No swelling or tenderness.  Skin:    General: Skin is warm and dry.          Comments: There is a well-circumscribed soft tissue mass adjacent to the spine just caudal to the tip of the scapula.  It is soft and mobile.  Neurological:     General: No focal deficit present.     Mental Status: She is alert and oriented to person, place, and time.  Psychiatric:        Mood and Affect: Mood  normal.        Behavior: Behavior normal.     Data Reviewed An ultrasound was performed in March 2020. The results are copied here: CLINICAL DATA:  Soft tissue mass back.  Lipoma.  EXAM: ULTRASOUND OF BACK SOFT TISSUES  TECHNIQUE: Ultrasound examination of the head and neck soft tissues was performed in the area of clinical concern.  COMPARISON:  None.  FINDINGS: Scanning in the upper to mid back slightly to the right of midline. Echogenic thickening of the paraspinous muscles is ill-defined and difficult to measure. Estimate is approximately 15 x 20 mm. No cystic mass identified.  IMPRESSION: Soft tissue thickening right paraspinous mass may represent lipoma however is inconclusive on ultrasound. If the mass is symptomatic or enlarging, further imaging is suggested. MRI thoracic spine with contrast would be most diagnostic. CT chest without contrast could also be performed.  I personally reviewed the imaging and to my eye, the area does appear consistent with a lipoma.  Results for CHEYANN, BLECHA (MRN [id number]) as of 03/19/2020 10:22  Ref. Range 03/05/2020 13:34  Sodium Latest Ref Range: 135 - 145 mEq/L 137  Potassium Latest Ref Range: 3.5 - 5.1 mEq/L 4.2  Chloride Latest Ref Range: 96 - 112 mEq/L 101  CO2 Latest Ref Range: 19 - 32 mEq/L 29  Glucose Latest Ref Range: 70 - 99 mg/dL 91  BUN Latest Ref Range: 6 - 23 mg/dL 13  Creatinine Latest Ref Range: 0.40 - 1.20 mg/dL 0.73  Calcium Latest Ref Range: 8.4 - 10.5 mg/dL 9.3  Alkaline Phosphatase Latest Ref Range: 39 - 117 U/L 65  Albumin Latest Ref Range: 3.5 - 5.2 g/dL 4.5  AST Latest Ref Range: 0 - 37 U/L 15  ALT Latest Ref Range: 0 - 35 U/L 15  Total Protein Latest Ref Range: 6.0 - 8.3 g/dL 7.1  Total Bilirubin Latest Ref Range: 0.2 - 1.2 mg/dL 0.4  GFR Latest Ref Range: >60.00 mL/min 88.56  Total CHOL/HDL Ratio Unknown 4  Cholesterol Latest Ref Range: 0 - 200 mg/dL 191  HDL Cholesterol Latest Ref Range:  >39.00 mg/dL 44.00  LDL (calc) Latest Ref Range: 0 - 99 mg/dL 125 (H)  NonHDL Unknown 147.22  Triglycerides Latest Ref Range: 0 - 149 mg/dL 110.0  VLDL Latest Ref Range: 0.0 - 40.0 mg/dL 22.0  Folate Latest Ref Range: >5.9 ng/mL >24.8  VITD Latest Ref Range: 30.00 - 100.00 ng/mL 34.30  Vitamin  B12 Latest Ref Range: 211 - 911 pg/mL 521  WBC Latest Ref Range: 4.0 - 10.5 K/uL 6.3  RBC Latest Ref Range: 3.87 - 5.11 Mil/uL 4.79  Hemoglobin Latest Ref Range: 12.0 - 15.0 g/dL 14.3  HCT Latest Ref Range: 36 - 46 % 42.3  MCV Latest Ref Range: 78.0 - 100.0 fl 88.4  MCHC Latest Ref Range: 30.0 - 36.0 g/dL 33.8  RDW Latest Ref Range: 11.5 - 15.5 % 12.7  Platelets Latest Ref Range: 150 - 400 K/uL 278.0   These recent labs are all essentially within normal range with a mild elevation of her low-density lipoprotein.  I also reviewed reviewed the clinic note dated 03/05/2020 from her primary care provider.  This resulted in the referral to surgery for evaluation.  Assessment This is a 39 year old woman who has a soft tissue lesion on her back that is most consistent clinically with a lipoma.  I have offered her removal.  The risks of surgery were discussed with her.  These include, but are not limited to, bleeding, infection, recurrence, scar, pain, wound healing issues, and the risks of anesthesia.  All of her questions were answered to her satisfaction and she wishes to proceed.  Plan We will work on getting her scheduled.    Fredirick Maudlin 03/19/2020, 10:14 AM

## 2020-03-24 ENCOUNTER — Telehealth: Payer: Self-pay | Admitting: General Surgery

## 2020-03-24 NOTE — Telephone Encounter (Signed)
Patient has been advised of Pre-Admission date/time, COVID Testing date and Surgery date.  Surgery Date: 04/17/20 Preadmission Testing Date: 04/06/20 (phone 8a-1p) Covid Testing Date: 04/15/20 - patient advised to go to the [facility] ([place]) between 8a-1p    Patient has been made aware to call [phone], between 1-3:00pm the day before surgery, to find out what time to arrive for surgery.

## 2020-04-06 ENCOUNTER — Other Ambulatory Visit: Payer: Self-pay

## 2020-04-06 ENCOUNTER — Encounter
Admission: RE | Admit: 2020-04-06 | Discharge: 2020-04-06 | Disposition: A | Discharge status: Home / Self Care | Payer: BC Managed Care – PPO | Source: Ambulatory Visit | Attending: General Surgery | Admitting: General Surgery

## 2020-04-06 NOTE — Patient Instructions (Signed)
Your procedure is scheduled on: 04/17/20 Report to [place]. To find out your arrival time please call [phone] between 1PM - 3PM on 04/16/20.  Remember: Instructions that are not followed completely may result in serious medical risk, up to and including death, or upon the discretion of your surgeon and anesthesiologist your surgery may need to be rescheduled.     _X__ 1. Do not eat food after midnight the night before your procedure.                 No gum chewing or hard candies. You may drink clear liquids up to 2 hours                 before you are scheduled to arrive for your surgery- DO not drink clear                 liquids within 2 hours of the start of your surgery.                 Clear Liquids include:  water, apple juice without pulp, clear carbohydrate                 drink such as Clearfast or Gatorade, Black Coffee or Tea (Do not add                 anything to coffee or tea). Diabetics water only  __X__2.  On the morning of surgery brush your teeth with toothpaste and water, you                 may rinse your mouth with mouthwash if you wish.  Do not swallow any              toothpaste of mouthwash.     _X__ 3.  No Alcohol for 24 hours before or after surgery.   _X__ 4.  Do Not Smoke or use e-cigarettes For 24 Hours Prior to Your Surgery.                 Do not use any chewable tobacco products for at least 6 hours prior to                 surgery.  ____  5.  Bring all medications with you on the day of surgery if instructed.   __X__  6.  Notify your doctor if there is any change in your medical condition      (cold, fever, infections).     Do not wear jewelry, make-up, hairpins, clips or nail polish. Do not wear lotions, powders, or perfumes.  Do not shave 48 hours prior to surgery. Men may shave face and neck. Do not bring valuables to the hospital.    [facility] is not responsible for any belongings  or valuables.  Contacts, dentures/partials or body piercings may not be worn into surgery. Bring a case for your contacts, glasses or hearing aids, a denture cup will be supplied. Leave your suitcase in the car. After surgery it may be brought to your room. For patients admitted to the hospital, discharge time is determined by your treatment team.   Patients discharged the day of surgery will not be allowed to drive home.   Please read over the following fact sheets that you were given:   MRSA Information  __X__ Take these medicines the morning of surgery with A SIP OF WATER:  1. none  2.   3.   4.  5.  6.  ____ Fleet Enema (as directed)   __X__ Use CHG Soap/SAGE wipes as directed  ____ Use inhalers on the day of surgery  ____ Stop metformin/Janumet/Farxiga 2 days prior to surgery    ____ Take 1/2 of usual insulin dose the night before surgery. No insulin the morning          of surgery.   ____ Stop Blood Thinners Coumadin/Plavix/Xarelto/Pleta/Pradaxa/Eliquis/Effient/Aspirin  on   Or contact your Surgeon, Cardiologist or Medical Doctor regarding  ability to stop your blood thinners  __X__ Stop Anti-inflammatories 7 days before surgery such as Advil, Ibuprofen, Motrin,  BC or Goodies Powder, Naprosyn, Naproxen, Aleve, Aspirin    __X__ Stop all herbal supplements, fish oil or vitamin E until after surgery.    ____ Bring C-Pap to the hospital.      How to Use Chlorhexidine for Bathing Chlorhexidine gluconate (CHG) is a germ-killing (antiseptic) solution that is used to clean the skin. It can get rid of the bacteria that normally live on the skin and can keep them away for about 24 hours. To clean your skin with CHG, you may be given:  A CHG solution to use in the shower or as part of a sponge bath.  A prepackaged cloth that contains CHG. Cleaning your skin with CHG may help lower the risk for infection:  While you are staying in the intensive care unit of the  hospital.  If you have a vascular access, such as a central line, to provide short-term or long-term access to your veins.  If you have a catheter to drain urine from your bladder.  If you are on a ventilator. A ventilator is a machine that helps you breathe by moving air in and out of your lungs.  After surgery. What are the risks? Risks of using CHG include:  A skin reaction.  Hearing loss, if CHG gets in your ears.  Eye injury, if CHG gets in your eyes and is not rinsed out.  The CHG product catching fire. Make sure that you avoid smoking and flames after applying CHG to your skin. Do not use CHG:  If you have a chlorhexidine allergy or have previously reacted to chlorhexidine.  On babies younger than 27 months of age. How to use CHG solution  Use CHG only as told by your health care provider, and follow the instructions on the label.  Use the full amount of CHG as directed. Usually, this is one bottle. During a shower Follow these steps when using CHG solution during a shower (unless your health care provider gives you different instructions): 1. Start the shower. 2. Use your normal soap and shampoo to wash your face and hair. 3. Turn off the shower or move out of the shower stream. 4. Pour the CHG onto a clean washcloth. Do not use any type of brush or rough-edged sponge. 5. Starting at your neck, lather your body down to your toes. Make sure you follow these instructions: ? If you will be having surgery, pay special attention to the part of your body where you will be having surgery. Scrub this area for at least 1 minute. ? Do not use CHG on your head or face. If the solution gets into your ears or eyes, rinse them well with water. ? Avoid your genital area. ? Avoid any areas of skin that have broken skin, cuts, or scrapes. ? Scrub your back  and under your arms. Make sure to wash skin folds. 6. Let the lather sit on your skin for 1-2 minutes or as long as told by your  health care provider. 7. Thoroughly rinse your entire body in the shower. Make sure that all body creases and crevices are rinsed well. 8. Dry off with a clean towel. Do not put any substances on your body afterward--such as powder, lotion, or perfume--unless you are told to do so by your health care provider. Only use lotions that are recommended by the manufacturer. 9. Put on clean clothes or pajamas. 10. If it is the night before your surgery, sleep in clean sheets.  During a sponge bath Follow these steps when using CHG solution during a sponge bath (unless your health care provider gives you different instructions): 1. Use your normal soap and shampoo to wash your face and hair. 2. Pour the CHG onto a clean washcloth. 3. Starting at your neck, lather your body down to your toes. Make sure you follow these instructions: ? If you will be having surgery, pay special attention to the part of your body where you will be having surgery. Scrub this area for at least 1 minute. ? Do not use CHG on your head or face. If the solution gets into your ears or eyes, rinse them well with water. ? Avoid your genital area. ? Avoid any areas of skin that have broken skin, cuts, or scrapes. ? Scrub your back and under your arms. Make sure to wash skin folds. 4. Let the lather sit on your skin for 1-2 minutes or as long as told by your health care provider. 5. Using a different clean, wet washcloth, thoroughly rinse your entire body. Make sure that all body creases and crevices are rinsed well. 6. Dry off with a clean towel. Do not put any substances on your body afterward--such as powder, lotion, or perfume--unless you are told to do so by your health care provider. Only use lotions that are recommended by the manufacturer. 7. Put on clean clothes or pajamas. 8. If it is the night before your surgery, sleep in clean sheets. How to use CHG prepackaged cloths  Only use CHG cloths as told by your health care  provider, and follow the instructions on the label.  Use the CHG cloth on clean, dry skin.  Do not use the CHG cloth on your head or face unless your health care provider tells you to.  When washing with the CHG cloth: ? Avoid your genital area. ? Avoid any areas of skin that have broken skin, cuts, or scrapes. Before surgery Follow these steps when using a CHG cloth to clean before surgery (unless your health care provider gives you different instructions): 1. Using the CHG cloth, vigorously scrub the part of your body where you will be having surgery. Scrub using a back-and-forth motion for 3 minutes. The area on your body should be completely wet with CHG when you are done scrubbing. 2. Do not rinse. Discard the cloth and let the area air-dry. Do not put any substances on the area afterward, such as powder, lotion, or perfume. 3. Put on clean clothes or pajamas. 4. If it is the night before your surgery, sleep in clean sheets.  For general bathing Follow these steps when using CHG cloths for general bathing (unless your health care provider gives you different instructions). 1. Use a separate CHG cloth for each area of your body. Make sure you wash between  any folds of skin and between your fingers and toes. Wash your body in the following order, switching to a new cloth after each step: ? The front of your neck, shoulders, and chest. ? Both of your arms, under your arms, and your hands. ? Your stomach and groin area, avoiding the genitals. ? Your right leg and foot. ? Your left leg and foot. ? The back of your neck, your back, and your buttocks. 2. Do not rinse. Discard the cloth and let the area air-dry. Do not put any substances on your body afterward--such as powder, lotion, or perfume--unless you are told to do so by your health care provider. Only use lotions that are recommended by the manufacturer. 3. Put on clean clothes or pajamas. Contact a health care provider if:  Your  skin gets irritated after scrubbing.  You have questions about using your solution or cloth. Get help right away if:  Your eyes become very red or swollen.  Your eyes itch badly.  Your skin itches badly and is red or swollen.  Your hearing changes.  You have trouble seeing.  You have swelling or tingling in your mouth or throat.  You have trouble breathing.  You swallow any chlorhexidine. Summary  Chlorhexidine gluconate (CHG) is a germ-killing (antiseptic) solution that is used to clean the skin. Cleaning your skin with CHG may help to lower your risk for infection.  You may be given CHG to use for bathing. It may be in a bottle or in a prepackaged cloth to use on your skin. Carefully follow your health care provider's instructions and the instructions on the product label.  Do not use CHG if you have a chlorhexidine allergy.  Contact your health care provider if your skin gets irritated after scrubbing. This information is not intended to replace advice given to you by your health care provider. Make sure you discuss any questions you have with your health care provider. Document Revised: 09/06/2018 Document Reviewed: 05/18/2017 Elsevier Patient Education  [place].

## 2020-04-15 ENCOUNTER — Other Ambulatory Visit
Admission: RE | Admit: 2020-04-15 | Discharge: 2020-04-15 | Disposition: A | Discharge status: Home / Self Care | Payer: BC Managed Care – PPO | Source: Ambulatory Visit | Attending: General Surgery | Admitting: General Surgery

## 2020-04-15 ENCOUNTER — Other Ambulatory Visit: Payer: Self-pay

## 2020-04-15 DIAGNOSIS — Z01812 Encounter for preprocedural laboratory examination: Secondary | ICD-10-CM | POA: Diagnosis not present

## 2020-04-15 DIAGNOSIS — Z20822 Contact with and (suspected) exposure to covid-19: Secondary | ICD-10-CM | POA: Insufficient documentation

## 2020-04-16 LAB — SARS CORONAVIRUS 2 (TAT 6-24 HRS): SARS Coronavirus 2: NEGATIVE

## 2020-04-17 ENCOUNTER — Ambulatory Visit: Payer: BC Managed Care – PPO | Admitting: Family

## 2020-04-17 ENCOUNTER — Ambulatory Visit
Admission: RE | Admit: 2020-04-17 | Discharge: 2020-04-17 | Disposition: A | Discharge status: Home / Self Care | Payer: BC Managed Care – PPO | Attending: General Surgery | Admitting: General Surgery

## 2020-04-17 ENCOUNTER — Other Ambulatory Visit: Payer: Self-pay | Admitting: General Surgery

## 2020-04-17 ENCOUNTER — Other Ambulatory Visit: Payer: Self-pay | Admitting: Physician Assistant

## 2020-04-17 ENCOUNTER — Encounter
Admission: RE | Disposition: A | Discharge status: Home / Self Care | Payer: Self-pay | Source: Home / Self Care | Attending: General Surgery

## 2020-04-17 ENCOUNTER — Other Ambulatory Visit: Payer: Self-pay

## 2020-04-17 ENCOUNTER — Encounter: Payer: Self-pay | Admitting: General Surgery

## 2020-04-17 DIAGNOSIS — Z79899 Other long term (current) drug therapy: Secondary | ICD-10-CM | POA: Diagnosis not present

## 2020-04-17 DIAGNOSIS — D171 Benign lipomatous neoplasm of skin and subcutaneous tissue of trunk: Secondary | ICD-10-CM | POA: Insufficient documentation

## 2020-04-17 HISTORY — PX: LIPOMA EXCISION: SHX5283

## 2020-04-17 LAB — POCT PREGNANCY, URINE: Preg Test, Ur: NEGATIVE

## 2020-04-17 SURGERY — EXCISION LIPOMA
Anesthesia: General

## 2020-04-17 MED ORDER — CHLORHEXIDINE GLUCONATE CLOTH 2 % EX PADS
6.0000 | MEDICATED_PAD | Freq: Once | CUTANEOUS | Status: AC
  Administered 2020-04-17: 6 via TOPICAL

## 2020-04-17 MED ORDER — ACETAMINOPHEN 500 MG PO TABS
ORAL_TABLET | ORAL | Status: AC
  Filled 2020-04-17: qty 2

## 2020-04-17 MED ORDER — ORAL CARE MOUTH RINSE: 15.0000 mL | Freq: Once | OROMUCOSAL | Status: AC

## 2020-04-17 MED ORDER — CELECOXIB 200 MG PO CAPS
ORAL_CAPSULE | ORAL | Status: AC
  Filled 2020-04-17: qty 1

## 2020-04-17 MED ORDER — CELECOXIB 200 MG PO CAPS
200.0000 mg | ORAL_CAPSULE | ORAL | Status: AC
  Administered 2020-04-17: 200 mg via ORAL

## 2020-04-17 MED ORDER — IBUPROFEN 600 MG PO TABS: 600.0000 mg | ORAL_TABLET | Freq: Four times a day (QID) | ORAL | 1 refills | Status: DC | PRN

## 2020-04-17 MED ORDER — FAMOTIDINE 20 MG PO TABS
ORAL_TABLET | ORAL | Status: AC
  Filled 2020-04-17: qty 1

## 2020-04-17 MED ORDER — OXYCODONE HCL 5 MG PO TABS: 5.0000 mg | ORAL_TABLET | Freq: Once | ORAL | Status: DC | PRN

## 2020-04-17 MED ORDER — ONDANSETRON HCL 4 MG/2ML IJ SOLN
INTRAMUSCULAR | Status: AC
  Filled 2020-04-17: qty 2

## 2020-04-17 MED ORDER — ROCURONIUM BROMIDE 10 MG/ML (PF) SYRINGE
PREFILLED_SYRINGE | INTRAVENOUS | Status: AC
  Filled 2020-04-17: qty 10

## 2020-04-17 MED ORDER — ACETAMINOPHEN 500 MG PO TABS: 1000.0000 mg | ORAL_TABLET | Freq: Four times a day (QID) | ORAL | 0 refills | Status: DC | PRN

## 2020-04-17 MED ORDER — MIDAZOLAM HCL 2 MG/2ML IJ SOLN
INTRAMUSCULAR | Status: AC
  Filled 2020-04-17: qty 2

## 2020-04-17 MED ORDER — OXYCODONE HCL 5 MG PO TABS
5.0000 mg | ORAL_TABLET | Freq: Four times a day (QID) | ORAL | 0 refills | Status: DC | PRN
Start: 2020-04-17 — End: 2020-04-17

## 2020-04-17 MED ORDER — GABAPENTIN 300 MG PO CAPS
ORAL_CAPSULE | ORAL | Status: AC
  Filled 2020-04-17: qty 1

## 2020-04-17 MED ORDER — CEFAZOLIN SODIUM-DEXTROSE 2-4 GM/100ML-% IV SOLN
2.0000 g | INTRAVENOUS | Status: AC
  Administered 2020-04-17: 2 g via INTRAVENOUS

## 2020-04-17 MED ORDER — FENTANYL CITRATE (PF) 100 MCG/2ML IJ SOLN
INTRAMUSCULAR | Status: AC
  Filled 2020-04-17: qty 2

## 2020-04-17 MED ORDER — FAMOTIDINE 20 MG PO TABS
20.0000 mg | ORAL_TABLET | Freq: Once | ORAL | Status: AC
  Administered 2020-04-17: 20 mg via ORAL

## 2020-04-17 MED ORDER — SUCCINYLCHOLINE CHLORIDE 20 MG/ML IJ SOLN
INTRAMUSCULAR | Status: DC | PRN
  Administered 2020-04-17: 100 mg via INTRAVENOUS

## 2020-04-17 MED ORDER — OXYCODONE HCL 5 MG/5ML PO SOLN: 5.0000 mg | Freq: Once | ORAL | Status: DC | PRN

## 2020-04-17 MED ORDER — LIDOCAINE-EPINEPHRINE 1 %-1:100000 IJ SOLN
INTRAMUSCULAR | Status: AC
  Filled 2020-04-17: qty 1

## 2020-04-17 MED ORDER — OXYCODONE HCL 5 MG PO TABS
5.0000 mg | ORAL_TABLET | Freq: Four times a day (QID) | ORAL | 0 refills | Status: AC | PRN
Start: 2020-04-17 — End: ?

## 2020-04-17 MED ORDER — BUPIVACAINE LIPOSOME 1.3 % IJ SUSP: 20.0000 mL | Freq: Once | INTRAMUSCULAR | Status: DC

## 2020-04-17 MED ORDER — CHLORHEXIDINE GLUCONATE 0.12 % MT SOLN
15.0000 mL | Freq: Once | OROMUCOSAL | Status: AC
  Administered 2020-04-17: 15 mL via OROMUCOSAL

## 2020-04-17 MED ORDER — GABAPENTIN 300 MG PO CAPS
300.0000 mg | ORAL_CAPSULE | ORAL | Status: AC
  Administered 2020-04-17: 300 mg via ORAL

## 2020-04-17 MED ORDER — MIDAZOLAM HCL 2 MG/2ML IJ SOLN
INTRAMUSCULAR | Status: DC | PRN
  Administered 2020-04-17: 2 mg via INTRAVENOUS

## 2020-04-17 MED ORDER — LACTATED RINGERS IV SOLN: INTRAVENOUS | Status: DC

## 2020-04-17 MED ORDER — BUPIVACAINE HCL (PF) 0.25 % IJ SOLN
INTRAMUSCULAR | Status: AC
  Filled 2020-04-17: qty 30

## 2020-04-17 MED ORDER — FENTANYL CITRATE (PF) 100 MCG/2ML IJ SOLN: 25.0000 ug | INTRAMUSCULAR | Status: DC | PRN

## 2020-04-17 MED ORDER — CEFAZOLIN SODIUM-DEXTROSE 2-4 GM/100ML-% IV SOLN
INTRAVENOUS | Status: AC
  Filled 2020-04-17: qty 100

## 2020-04-17 MED ORDER — PROPOFOL 10 MG/ML IV BOLUS
INTRAVENOUS | Status: DC | PRN
  Administered 2020-04-17: 150 mg via INTRAVENOUS

## 2020-04-17 MED ORDER — LIDOCAINE HCL (CARDIAC) PF 100 MG/5ML IV SOSY
PREFILLED_SYRINGE | INTRAVENOUS | Status: DC | PRN
  Administered 2020-04-17: 100 mg via INTRAVENOUS

## 2020-04-17 MED ORDER — ACETAMINOPHEN 500 MG PO TABS
1000.0000 mg | ORAL_TABLET | ORAL | Status: AC
  Administered 2020-04-17: 1000 mg via ORAL

## 2020-04-17 MED ORDER — ONDANSETRON HCL 4 MG/2ML IJ SOLN
INTRAMUSCULAR | Status: DC | PRN
  Administered 2020-04-17: 4 mg via INTRAVENOUS

## 2020-04-17 MED ORDER — DEXAMETHASONE SODIUM PHOSPHATE 10 MG/ML IJ SOLN
INTRAMUSCULAR | Status: DC | PRN
  Administered 2020-04-17: 10 mg via INTRAVENOUS

## 2020-04-17 MED ORDER — IBUPROFEN 600 MG PO TABS: 600.0000 mg | ORAL_TABLET | Freq: Four times a day (QID) | ORAL | 1 refills | Status: AC | PRN

## 2020-04-17 MED ORDER — PROPOFOL 10 MG/ML IV BOLUS
INTRAVENOUS | Status: AC
  Filled 2020-04-17: qty 40

## 2020-04-17 MED ORDER — LIDOCAINE-EPINEPHRINE 1 %-1:100000 IJ SOLN
INTRAMUSCULAR | Status: DC | PRN
  Administered 2020-04-17: 13 mL via INTRAMUSCULAR
  Administered 2020-04-17: 7 mL via INTRAMUSCULAR

## 2020-04-17 MED ORDER — PROMETHAZINE HCL 25 MG/ML IJ SOLN: 6.2500 mg | INTRAMUSCULAR | Status: DC | PRN

## 2020-04-17 MED ORDER — LIDOCAINE HCL (PF) 2 % IJ SOLN
INTRAMUSCULAR | Status: AC
  Filled 2020-04-17: qty 5

## 2020-04-17 MED ORDER — CHLORHEXIDINE GLUCONATE 0.12 % MT SOLN
OROMUCOSAL | Status: AC
  Filled 2020-04-17: qty 15

## 2020-04-17 MED ORDER — OXYCODONE HCL 5 MG PO TABS: 5.0000 mg | ORAL_TABLET | Freq: Four times a day (QID) | ORAL | 0 refills | Status: DC | PRN

## 2020-04-17 MED ORDER — FENTANYL CITRATE (PF) 100 MCG/2ML IJ SOLN
INTRAMUSCULAR | Status: DC | PRN
Start: 2020-04-17 — End: 2020-04-17
  Administered 2020-04-17 (×2): 25 ug via INTRAVENOUS
  Administered 2020-04-17: 50 ug via INTRAVENOUS

## 2020-04-17 MED ORDER — LIDOCAINE-EPINEPHRINE 2 %-1:100000 IJ SOLN
INTRAMUSCULAR | Status: AC
  Filled 2020-04-17: qty 1

## 2020-04-17 SURGICAL SUPPLY — 37 items
ADH SKN CLS APL DERMABOND .7 (GAUZE/BANDAGES/DRESSINGS) ×1
APL PRP STRL LF DISP 70% ISPRP (MISCELLANEOUS) ×1
BACTOSHIELD CHG 4% 4OZ (MISCELLANEOUS)
BLADE SURG 15 STRL LF DISP TIS (BLADE) ×1 IMPLANT
BLADE SURG 15 STRL SS (BLADE) ×2
CANISTER SUCT 1200ML W/VALVE (MISCELLANEOUS) IMPLANT
CHLORAPREP W/TINT 26 (MISCELLANEOUS) ×2 IMPLANT
COVER WAND RF STERILE (DRAPES) IMPLANT
DECANTER SPIKE VIAL GLASS SM (MISCELLANEOUS) IMPLANT
DERMABOND ADVANCED (GAUZE/BANDAGES/DRESSINGS) ×1
DERMABOND ADVANCED .7 DNX12 (GAUZE/BANDAGES/DRESSINGS) ×1 IMPLANT
DRAPE LAPAROTOMY 77X122 PED (DRAPES) ×2 IMPLANT
DRAPE MAG INST 16X20 L/F (DRAPES) IMPLANT
DRSG TELFA 4X8 ISLAND PHMB (GAUZE/BANDAGES/DRESSINGS) ×2 IMPLANT
ELECT CAUTERY BLADE TIP 2.5 (TIP) ×2
ELECT REM PT RETURN 9FT ADLT (ELECTROSURGICAL) ×2
ELECTRODE CAUTERY BLDE TIP 2.5 (TIP) ×1 IMPLANT
ELECTRODE REM PT RTRN 9FT ADLT (ELECTROSURGICAL) ×1 IMPLANT
GLOVE BIO SURGEON STRL SZ 6.5 (GLOVE) ×4 IMPLANT
GLOVE INDICATOR 7.0 STRL GRN (GLOVE) ×4 IMPLANT
GOWN STRL REUS W/ TWL LRG LVL3 (GOWN DISPOSABLE) ×2 IMPLANT
GOWN STRL REUS W/TWL LRG LVL3 (GOWN DISPOSABLE) ×4
NEEDLE HYPO 25X1 1.5 SAFETY (NEEDLE) ×2 IMPLANT
NS IRRIG 500ML POUR BTL (IV SOLUTION) ×2 IMPLANT
PACK BASIN MINOR (MISCELLANEOUS) ×2 IMPLANT
SCRUB CHG 4% DYNA-HEX 4OZ (MISCELLANEOUS) IMPLANT
SPONGE LAP 18X18 RF (DISPOSABLE) ×2 IMPLANT
STRIP CLOSURE SKIN 1/2X4 (GAUZE/BANDAGES/DRESSINGS) ×2 IMPLANT
SUT MNCRL 4-0 (SUTURE) ×2
SUT MNCRL 4-0 27XMFL (SUTURE) ×1
SUT VIC AB 2-0 SH 27 (SUTURE) ×2
SUT VIC AB 2-0 SH 27XBRD (SUTURE) ×1 IMPLANT
SUT VIC AB 3-0 SH 27 (SUTURE) ×2
SUT VIC AB 3-0 SH 27X BRD (SUTURE) ×1 IMPLANT
SUTURE MNCRL 4-0 27XMF (SUTURE) ×1 IMPLANT
SYR 10ML LL (SYRINGE) ×2 IMPLANT
SYR BULB IRRIG 60ML STRL (SYRINGE) ×2 IMPLANT

## 2020-04-17 NOTE — Discharge Instructions (Signed)
Lipoma Removal, Care After This sheet gives you information about how to care for yourself after your procedure. Your health care provider may also give you more specific instructions. If you have problems or questions, contact your health care provider. What can I expect after the procedure? After the procedure, it is common to have:  Mild pain.  Swelling.  Bruising. Follow these instructions at home: Bathing   Do not take baths, swim, or use a hot tub until your health care provider approves. Ask your health care provider if you may take showers. You may only be allowed to take sponge baths.  Keep your bandage (dressing) dry until your health care provider says it can be removed. Incision care   Follow instructions from your health care provider about how to take care of your incision. Make sure you: ? Wash your hands with soap and water for at least 20 seconds before and after you change your dressing. If soap and water are not available, use hand sanitizer. ? Change your dressing as told by your health care provider. ? Leave stitches (sutures), skin glue, or adhesive strips in place. These skin closures may need to stay in place for 2 weeks or longer. If adhesive strip edges start to loosen and curl up, you may trim the loose edges. Do not remove adhesive strips completely unless your health care provider tells you to do that.  Check your incision area every day for signs of infection. Check for: ? More redness, swelling, or pain. ? Fluid or blood. ? Warmth. ? Pus or a bad smell. Medicines  Take over-the-counter and prescription medicines only as told by your health care provider.  If you were prescribed an antibiotic medicine, use it as told by your health care provider. Do not stop using the antibiotic even if you start to feel better. General instructions   If you were given a sedative during the procedure, it can affect you for several hours. Do not drive or operate  machinery until your health care provider says that it is safe.  Do not use any products that contain nicotine or tobacco, such as cigarettes, e-cigarettes, and chewing tobacco. These can delay healing. If you need help quitting, ask your health care provider.  Return to your normal activities as told by your health care provider. Ask your health care provider what activities are safe for you.  Keep all follow-up visits as told by your health care provider. This is important. Contact a health care provider if:  You have more redness, swelling, or pain around your incision.  You have fluid or blood coming from your incision.  Your incision feels warm to the touch.  You have pus or a bad smell coming from your incision.  You have pain that does not get better with medicine. Get help right away if:  You have chills or a fever.  You have severe pain. Summary  After the procedure, it is common to have mild pain, swelling, and bruising.  Follow instructions from your health care provider about how to take care of your incision.  Check your incision area every day for signs of infection.  Contact a health care provider if you have more redness, swelling, or pain around your incision. This information is not intended to replace advice given to you by your health care provider. Make sure you discuss any questions you have with your health care provider. Document Revised: 02/04/2019 Document Reviewed: 02/04/2019 Elsevier Patient Education  2020 Elsevier   Inc.  

## 2020-04-17 NOTE — Transfer of Care (Signed)
Immediate Anesthesia Transfer of Care Note  Patient: Terry Henderson  Procedure(s) Performed: EXCISION LIPOMA, back (N/A )  Patient Location: PACU  Anesthesia Type:General  Level of Consciousness: awake, drowsy and patient cooperative  Airway & Oxygen Therapy: Patient Spontanous Breathing and Patient connected to face mask oxygen  Post-op Assessment: Report given to RN and Post -op Vital signs reviewed and stable  Post vital signs: Reviewed and stable  Last Vitals:  Vitals Value Taken Time  BP 125/78 04/17/20 0837  Temp    Pulse 87 04/17/20 0838  Resp 19 04/17/20 0837  SpO2 100 % 04/17/20 0838  Vitals shown include unvalidated device data.  Last Pain:  Vitals:   04/17/20 0625  TempSrc: Temporal  PainSc: 0-No pain         Complications: No complications documented.

## 2020-04-17 NOTE — Op Note (Signed)
Operative Note  Preoperative Diagnosis: Soft tissue mass of back  Postoperative Diagnosis: Same  Operation: Excision of soft tissue mass of back measuring 6.5 x 4.5 x 2 cm  Surgeon: Fredirick Maudlin, MD  Assistant: None  Anesthesia: General endotracheal  Findings: There was a well-circumscribed and encapsulated soft tissue mass consistent with a lipoma deep to the skin and above the muscular fascia.  Indications: This is a 39 year old woman who first noticed a small lump in her upper back sometime ago.  It has grown in size and is causing discomfort secondary to pressure in the area.  She was interested in surgical removal.  Clinically, it appeared consistent with a benign lipoma.  The risks of excision were discussed with her and she agreed to proceed.  Procedure In Detail: The patient was identified in the preoperative holding area where the lesion was appropriately marked.  She was then brought to the operating room and intubated on her stretcher.  She was turned into the prone position on the OR table.  Her body was supported on a purpose-made chest support.  Bony prominences were carefully padded.  Her arms were supported on the OR table arm boards.  She was then sterilely prepped and draped in standard fashion.  A timeout was performed confirming her identity, the procedure being performed, her allergies, all necessary equipment was available, and that maintenance anesthesia was adequate.  The skin overlying and surrounding the soft tissue mass was infiltrated with a one-to-one mixture of 0.25% bupivacaine and 1% lidocaine with epinephrine.  The skin was then incised sharply along Langer's lines.  Electrocautery and blunt dissection was used to mobilize the soft tissue mass circumferentially and down to the fascia.  It was completely excised, measured, and handed off as a specimen.  The wound bed was irrigated and hemostasis was achieved with electrocautery.  Additional local anesthetic was  infiltrated along the dissection bed.  The wound was then closed in 2 layers.  The deep dermis was approximated with 2-0 Vicryl and the skin was closed with running subcuticular Monocryl.  The skin was cleaned.  Dermabond and Steri-Strips were applied.  A sterile dressing was applied over the Steri-Strips.  The patient was then turned supine on her stretcher.  She was awakened, extubated, and taken to the postanesthesia care unit in good condition.  EBL: Less than 2 cc  IVF: See anesthesia record  Specimen(s): Soft tissue mass of back  Complications: none immediately apparent.   Counts: all needles, instruments, and sponges were counted and reported to be correct in number at the end of the case.   I was present for and participated in the entire operation.  Fredirick Maudlin 8:42 AM

## 2020-04-17 NOTE — Anesthesia Procedure Notes (Signed)
Procedure Name: Intubation Date/Time: 04/17/2020 7:36 AM Performed by: Gentry Fitz, CRNA Pre-anesthesia Checklist: Patient identified, Emergency Drugs available, Suction available and Patient being monitored Patient Re-evaluated:Patient Re-evaluated prior to induction Oxygen Delivery Method: Circle system utilized Preoxygenation: Pre-oxygenation with 100% oxygen Induction Type: IV induction and Cricoid Pressure applied Ventilation: Mask ventilation without difficulty Laryngoscope Size: Mac and 4 Grade View: Grade II Tube type: Oral Tube size: 7.0 mm Number of attempts: 1 Airway Equipment and Method: Stylet Placement Confirmation: ETT inserted through vocal cords under direct vision,  positive ETCO2 and breath sounds checked- equal and bilateral Secured at: 20 cm Tube secured with: Tape Dental Injury: Teeth and Oropharynx as per pre-operative assessment

## 2020-04-17 NOTE — Interval H&P Note (Signed)
History and Physical Interval Note:  04/17/2020 7:16 AM  Terry Henderson  has presented today for surgery, with the diagnosis of Lipoma of back.  The various methods of treatment have been discussed with the patient and family. After consideration of risks, benefits and other options for treatment, the patient has consented to  Procedure(s): EXCISION LIPOMA, back (N/A) as a surgical intervention.  The patient's history has been reviewed, patient examined, no change in status, stable for surgery.  I have reviewed the patient's chart and labs.  Questions were answered to the patient's satisfaction.     Fredirick Maudlin

## 2020-04-17 NOTE — Anesthesia Preprocedure Evaluation (Addendum)
Anesthesia Evaluation  Patient identified by MRN, date of birth, ID band Patient awake    Reviewed: Allergy & Precautions, H&P , NPO status , Patient's Chart, lab work & pertinent test results  History of Anesthesia Complications Negative for: history of anesthetic complications  Airway Mallampati: II  TM Distance: >3 FB Neck ROM: full    Dental  (+) Teeth Intact   Pulmonary neg pulmonary ROS, neg sleep apnea, neg COPD,    breath sounds clear to auscultation       Cardiovascular (-) angina(-) Past MI and (-) Cardiac Stents negative cardio ROS  (-) dysrhythmias  Rhythm:regular Rate:Normal     Neuro/Psych negative neurological ROS  negative psych ROS   GI/Hepatic negative GI ROS, Neg liver ROS,   Endo/Other  negative endocrine ROS  Renal/GU      Musculoskeletal   Abdominal   Peds  Hematology negative hematology ROS (+)   Anesthesia Other Findings Obese  Past Medical History: No date: Abnormal Pap smear of cervix No date: Chickenpox No date: Hay fever No date: Human papilloma virus  Past Surgical History: No date: CESAREAN SECTION     Comment:  x2 07/16/2019: CESAREAN SECTION; N/A     Comment:  Procedure: CESAREAN SECTION;  Surgeon: Will Bonnet, MD;  Location: [facility] ORS;  Service: Obstetrics;                Laterality: N/A; No date: CRYOTHERAPY 08/26/2019: Mirena insert     Reproductive/Obstetrics negative OB ROS                            Anesthesia Physical Anesthesia Plan  ASA: II  Anesthesia Plan: General ETT   Post-op Pain Management:    Induction:   PONV Risk Score and Plan: Dexamethasone, Ondansetron, Midazolam and Treatment may vary due to age or medical condition  Airway Management Planned:   Additional Equipment:   Intra-op Plan:   Post-operative Plan:   Informed Consent: I have reviewed the patients History and Physical, chart,  labs and discussed the procedure including the risks, benefits and alternatives for the proposed anesthesia with the patient or authorized representative who has indicated his/her understanding and acceptance.     Dental Advisory Given  Plan Discussed with: Anesthesiologist, CRNA and Surgeon  Anesthesia Plan Comments:        Anesthesia Quick Evaluation

## 2020-04-18 NOTE — Anesthesia Postprocedure Evaluation (Signed)
Anesthesia Post Note  Patient: Makaelah Cranfield  Procedure(s) Performed: EXCISION LIPOMA, back (N/A )  Patient location during evaluation: PACU Anesthesia Type: General Level of consciousness: awake and alert Pain management: pain level controlled Vital Signs Assessment: post-procedure vital signs reviewed and stable Respiratory status: spontaneous breathing, nonlabored ventilation and respiratory function stable Cardiovascular status: blood pressure returned to baseline and stable Postop Assessment: no apparent nausea or vomiting Anesthetic complications: no   No complications documented.   Last Vitals:  Vitals:   04/17/20 0907 04/17/20 0917  BP: 120/78 128/74  Pulse: 70 64  Resp: 15 16  Temp:  (!) 36.2 C  SpO2: 98% 98%    Last Pain:  Vitals:   04/17/20 0917  TempSrc: Temporal  PainSc: 0-No pain                 Brett Canales Armida Vickroy

## 2020-04-20 ENCOUNTER — Encounter: Payer: Self-pay | Admitting: General Surgery

## 2020-04-20 LAB — SURGICAL PATHOLOGY

## 2020-05-01 ENCOUNTER — Other Ambulatory Visit: Payer: Self-pay

## 2020-05-01 ENCOUNTER — Encounter: Payer: Self-pay | Admitting: Physician Assistant

## 2020-05-01 ENCOUNTER — Ambulatory Visit (INDEPENDENT_AMBULATORY_CARE_PROVIDER_SITE_OTHER): Payer: BC Managed Care – PPO | Admitting: Physician Assistant

## 2020-05-01 VITALS — BP 104/65 | HR 79 | Temp 98.2°F | Ht 66.0 in | Wt 216.0 lb

## 2020-05-01 DIAGNOSIS — D171 Benign lipomatous neoplasm of skin and subcutaneous tissue of trunk: Secondary | ICD-10-CM

## 2020-05-01 DIAGNOSIS — Z09 Encounter for follow-up examination after completed treatment for conditions other than malignant neoplasm: Secondary | ICD-10-CM

## 2020-05-01 NOTE — Patient Instructions (Signed)
Follow-up with our office as needed.  Please call and ask to speak with a nurse if you develop questions or concerns.   May rub Vitamin-E oil or other emmolient agent in area 2-3 times a day to soften. You will need to use sunscreen for the next year on the area to minimize altered pigmentation of the site.

## 2020-05-01 NOTE — Progress Notes (Signed)
[facility] POST-OP OFFICE VISIT  05/01/2020  HPI: Terry Henderson is a 39 y.o. female 14 days s/p excision of lipoma from back with Dr Celine Ahr  She is doing well No complaints of pain, not taking anything for pain No erythema, drainage, fever, chills Pathology consistent with Lipoma No other complaints  Vital signs: BP 104/65   Pulse 79   Temp 98.2 F (36.8 C)   Ht 5\' 6"  (1.676 m)   Wt 216 lb (98 kg)   LMP  (LMP Unknown)   SpO2 96%   BMI 34.86 kg/m    Physical Exam: Constitutional: Well appearing female, NAD Skin: Incision to the mid back jest right of midline, this is healing well, expected surrounding induration, no erythema or drainage   Assessment/Plan: This is a 39 y.o. female 14 days s/p excision of lipoma from back   - Pain control prn  - Reviewed signs & symptoms of infection  - Reviewed pathology: Lipoma, negative for malignancy  - RTC prn   -- Edison Simon, PA-C [facility] 05/01/2020, 10:19 AM [phone] M-F: 7am - 4pm

## 2020-07-29 ENCOUNTER — Other Ambulatory Visit: Payer: BC Managed Care – PPO

## 2020-07-29 DIAGNOSIS — Z20822 Contact with and (suspected) exposure to covid-19: Secondary | ICD-10-CM

## 2020-07-30 LAB — SARS-COV-2, NAA 2 DAY TAT

## 2020-07-30 LAB — NOVEL CORONAVIRUS, NAA: SARS-CoV-2, NAA: NOT DETECTED

## 2021-01-09 IMAGING — US US SOFT TISSUE EXCLUDE HEAD/NECK
1 series · 14 of 15 positions shown · non-contrast
Comparison: None.

CLINICAL DATA: Soft tissue mass back.  Lipoma.

EXAM:
ULTRASOUND OF BACK SOFT TISSUES
TECHNIQUE: Ultrasound examination of the head and neck soft tissues was
performed in the area of clinical concern.

[Series 1: us soft tissue exclude head/neck · 15 acquisitions, 14 frames shown]
[im 1/15]
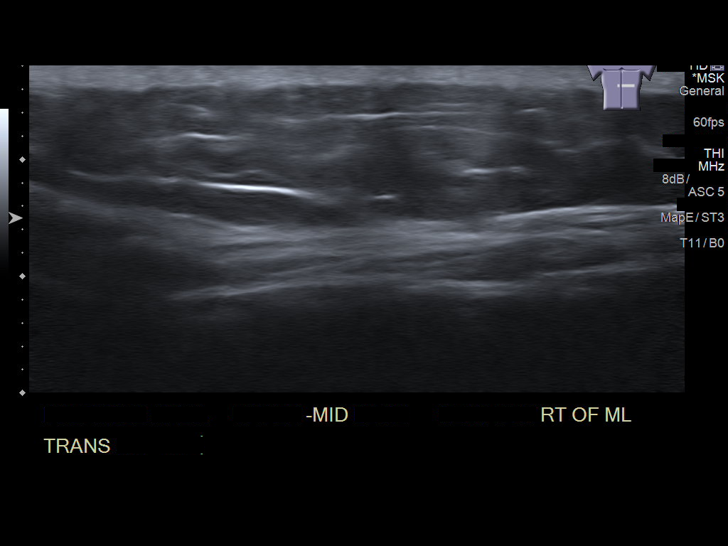
[im 2/15]
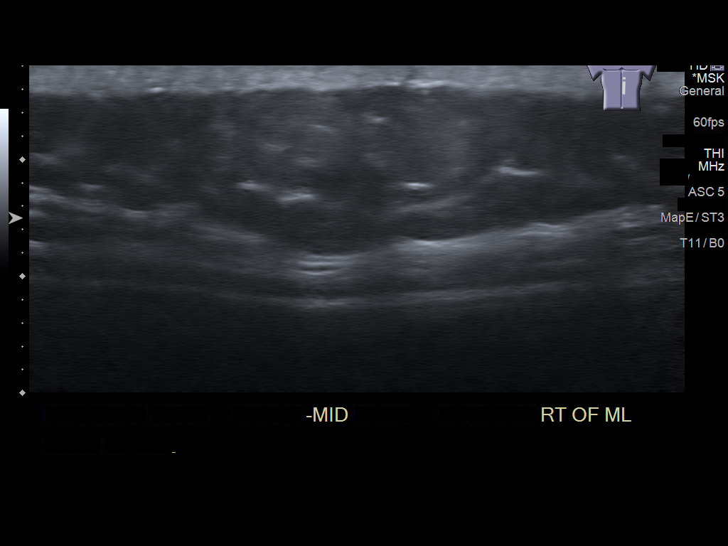
[im 3/15]
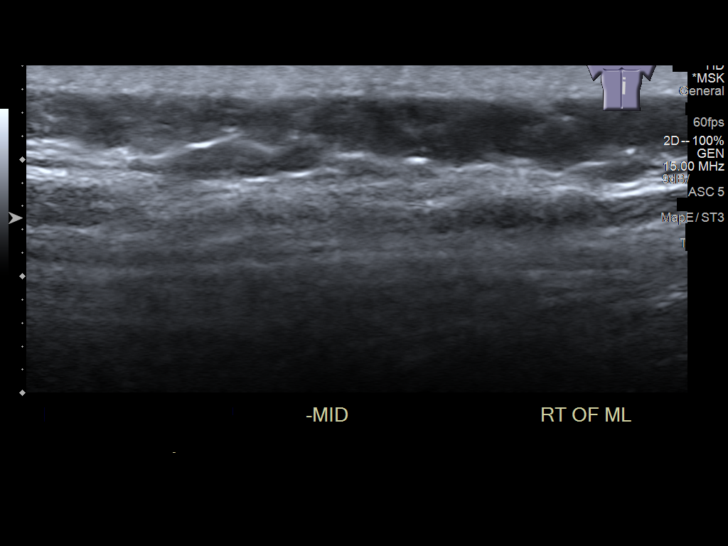
[im 4/15]
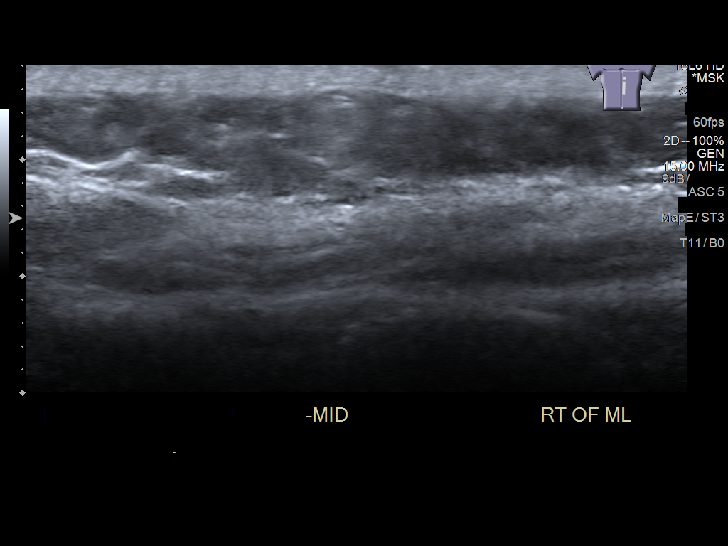
[im 5/15]
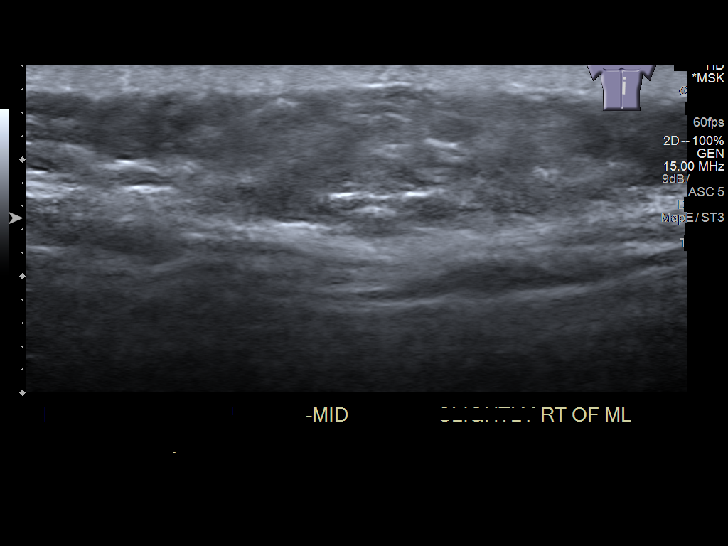
[im 6/15]
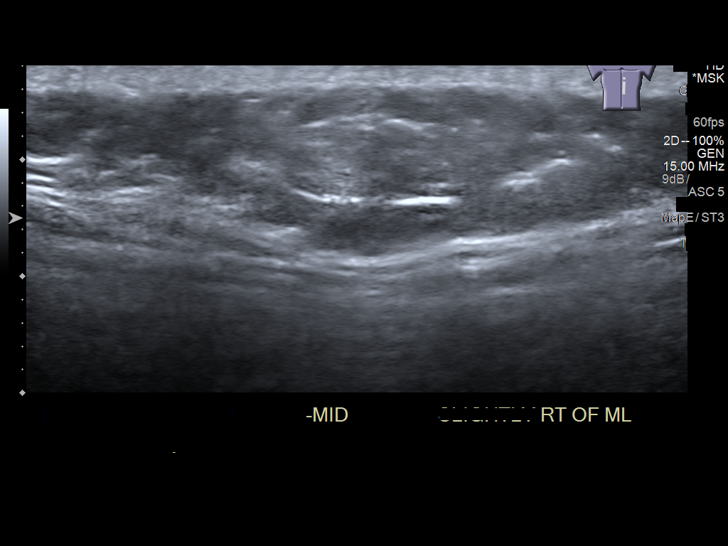
[im 7/15]
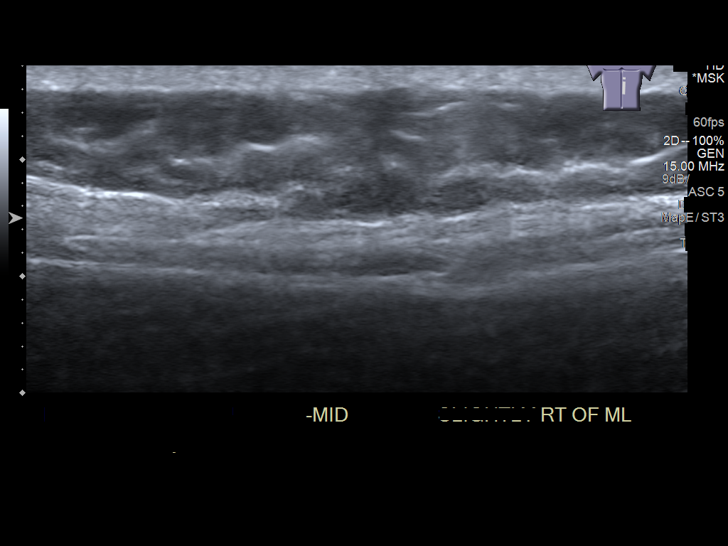
[im 9/15]
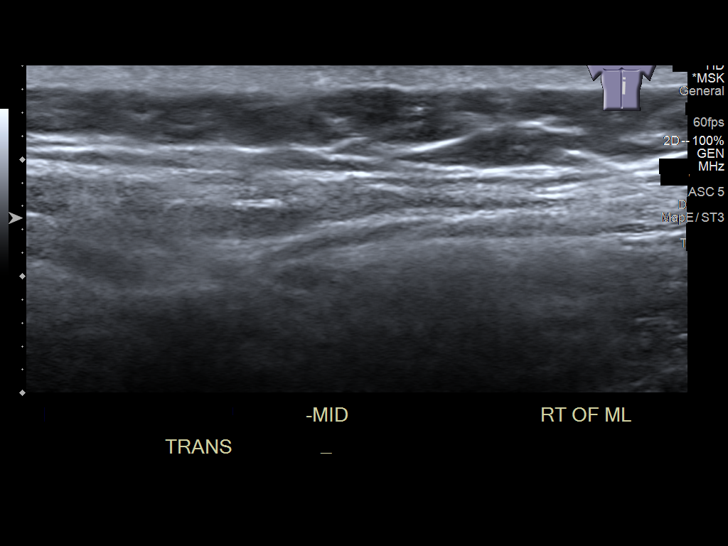
[im 10/15]
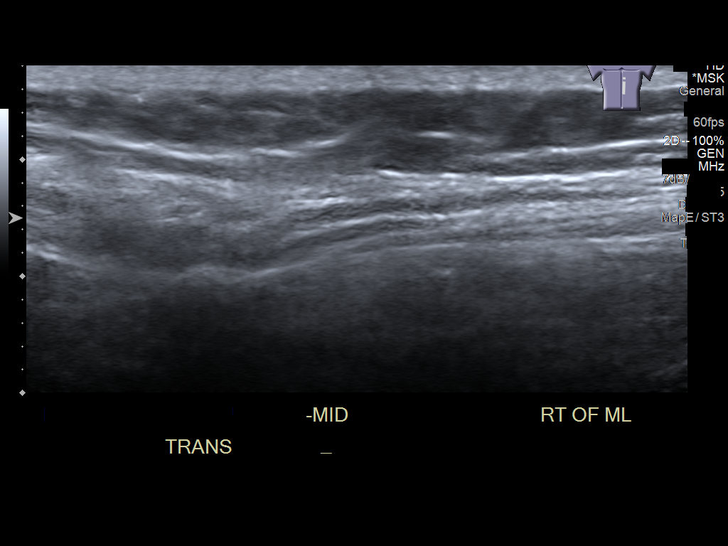
[im 11/15]
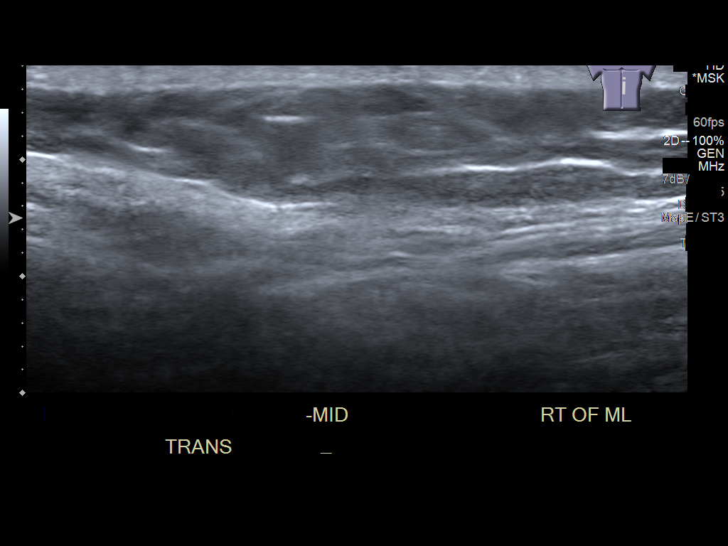
[im 12/15]
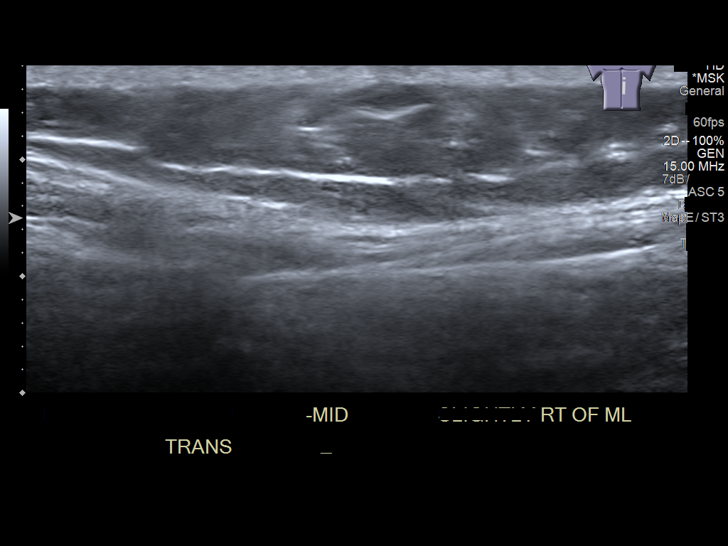
[im 13/15]
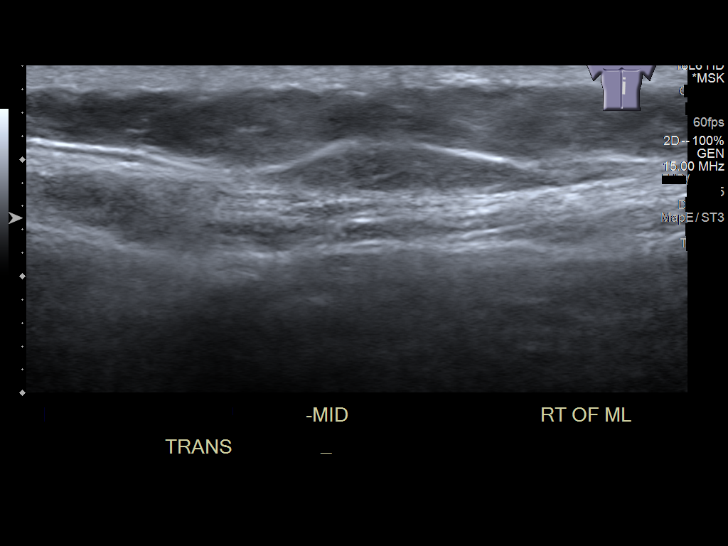
[im 14/15]
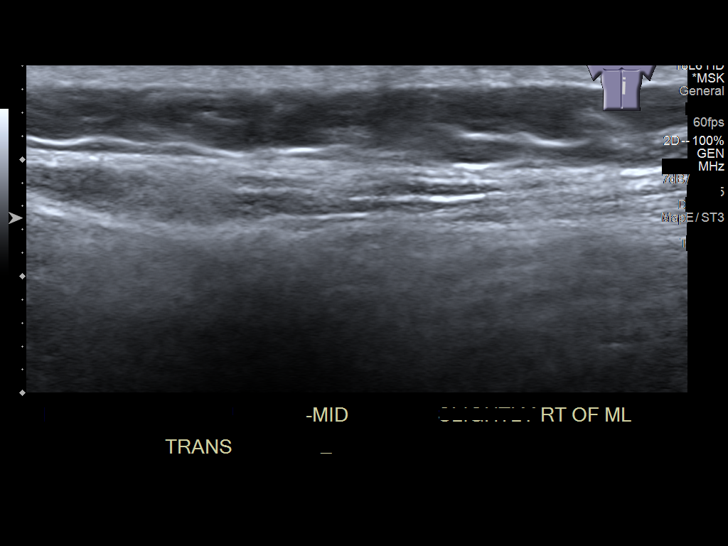
[im 15/15]
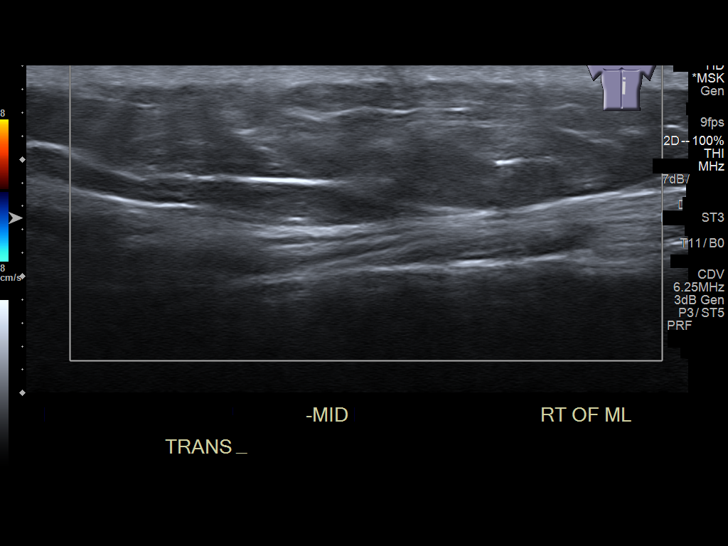

[14 of 15 positions shown; findings below may reference images not displayed]

FINDINGS: Scanning in the upper to mid back slightly to the right of midline.
Echogenic thickening of the paraspinous muscles is ill-defined and
difficult to measure. Estimate is approximately 15 x 20 mm. No
cystic mass identified.
IMPRESSION: Soft tissue thickening right paraspinous mass may represent lipoma
however is inconclusive on ultrasound. If the mass is symptomatic or
enlarging, further imaging is suggested. MRI thoracic spine with
contrast would be most diagnostic. CT chest without contrast could
also be performed.

## 2021-03-31 ENCOUNTER — Encounter: Payer: Self-pay | Admitting: General Surgery

## 2021-12-14 DIAGNOSIS — Z01419 Encounter for gynecological examination (general) (routine) without abnormal findings: Secondary | ICD-10-CM | POA: Diagnosis not present

## 2021-12-14 DIAGNOSIS — Z1339 Encounter for screening examination for other mental health and behavioral disorders: Secondary | ICD-10-CM | POA: Diagnosis not present

## 2021-12-14 DIAGNOSIS — N644 Mastodynia: Secondary | ICD-10-CM | POA: Diagnosis not present

## 2021-12-14 DIAGNOSIS — Z1331 Encounter for screening for depression: Secondary | ICD-10-CM | POA: Diagnosis not present

## 2021-12-15 ENCOUNTER — Other Ambulatory Visit: Payer: Self-pay | Admitting: Obstetrics and Gynecology

## 2021-12-15 DIAGNOSIS — Z1231 Encounter for screening mammogram for malignant neoplasm of breast: Secondary | ICD-10-CM

## 2021-12-15 DIAGNOSIS — N644 Mastodynia: Secondary | ICD-10-CM

## 2021-12-18 NOTE — Progress Notes (Deleted)
PCP:  Marval Regal, NP   No chief complaint on file.    HPI:      [name] is a 41 y.o. [phone] whose LMP was No LMP recorded. (Menstrual status: IUD)., presents today for her annual examination.  Her menses are {norm/abn:715}, lasting {number: 22536} days.  Dysmenorrhea {dysmen:716}. She {does:18564} have intermenstrual bleeding.  Sex activity: {sex active: 315163}. Mirena placed 08/26/19 Last Pap: 08/17/18 Results were: no abnormalities /neg HPV DNA Hx of STDs:  Last mammogram: {date:304500300}  Results were: {norm/abn:13465} Has mammo scheduled 12/31/21 There is no FH of breast cancer. There is no FH of ovarian cancer. The patient {does:18564} do self-breast exams.  Tobacco use: {tob:20664} Alcohol use: {Alcohol:11675} No drug use.  Exercise: {exercise:31265}  She {does:18564} get adequate calcium and Vitamin D in her diet.  Patient Active Problem List   Diagnosis Date Noted   Encounter for preventive care 03/05/2020   Lipoma of torso 03/05/2020   Obesity, Class II, BMI 35-39.9, isolated (see actual BMI) 03/05/2020   BMI 38.0-38.9,adult 03/05/2020   History of cesarean delivery 07/16/2019   Malpresentation before onset of labor 07/07/2019   BMI 36.0-36.9,adult 03/19/2019   History of 2 cesarean sections 11/20/2018   Supervision of high-risk pregnancy 11/20/2018   Advanced maternal age in multigravida 11/20/2018   Obesity affecting pregnancy, antepartum 11/20/2018    Past Surgical History:  Procedure Laterality Date   CESAREAN SECTION     x2   CESAREAN SECTION N/A 07/16/2019   Procedure: CESAREAN SECTION;  Surgeon: Will Bonnet, MD;  Location: [facility] ORS;  Service: Obstetrics;  Laterality: N/A;   CRYOTHERAPY     LIPOMA EXCISION N/A 04/17/2020   Procedure: EXCISION LIPOMA, back;  Surgeon: Fredirick Maudlin, MD;  Location: [facility] ORS;  Service: General;  Laterality: N/A;   Mirena insert  08/26/2019    Family History  Problem Relation Age of Onset    Hypertension Mother    Heart attack Maternal Grandmother    Hyperlipidemia Maternal Grandmother    Stroke Maternal Grandmother    Hyperlipidemia Maternal Grandfather    Heart attack Maternal Grandfather    Early death Paternal Grandmother     Social History   Socioeconomic History   Marital status: Married    Spouse name: Not on file   Number of children: Not on file   Years of education: Not on file   Highest education level: Not on file  Occupational History   Not on file  Tobacco Use   Smoking status: Never   Smokeless tobacco: Never  Vaping Use   Vaping Use: Never used  Substance and Sexual Activity   Alcohol use: No   Drug use: No   Sexual activity: Yes    Birth control/protection: I.U.D.  Other Topics Concern   Not on file  Social History Narrative   Not on file   Social Determinants of Health   Financial Resource Strain: Not on file  Food Insecurity: Not on file  Transportation Needs: Not on file  Physical Activity: Not on file  Stress: Not on file  Social Connections: Not on file  Intimate Partner Violence: Not on file     Current Outpatient Medications:    ibuprofen (ADVIL) 600 MG tablet, Take 1 tablet (600 mg total) by mouth every 6 (six) hours as needed for mild pain or moderate pain., Disp: 30 tablet, Rfl: 1   levonorgestrel (MIRENA) 20 MCG/24HR IUD, 1 each by Intrauterine route once., Disp: , Rfl:  Multiple Vitamins-Minerals (MULTIVITAMIN WITH MINERALS) tablet, Take 1 tablet by mouth daily., Disp: , Rfl:    oxyCODONE (OXY IR/ROXICODONE) 5 MG immediate release tablet, Take 1 tablet (5 mg total) by mouth every 6 (six) hours as needed for severe pain or breakthrough pain., Disp: 10 tablet, Rfl: 0     ROS:  Review of Systems BREAST: No symptoms   Objective: There were no vitals taken for this visit.   OBGyn Exam  Results: No results found for this or any previous visit (from the past 24 hour(s)).  Assessment/Plan: No diagnosis  found.  No orders of the defined types were placed in this encounter.            GYN counsel {counseling: 16159}     F/U  No follow-ups on file.  Rebbecca Osuna B. Dontrez Pettis, PA-C 12/18/2021 12:29 PM

## 2021-12-21 ENCOUNTER — Ambulatory Visit: Payer: BC Managed Care – PPO | Admitting: Obstetrics and Gynecology

## 2021-12-21 DIAGNOSIS — Z01419 Encounter for gynecological examination (general) (routine) without abnormal findings: Secondary | ICD-10-CM

## 2021-12-21 DIAGNOSIS — Z1231 Encounter for screening mammogram for malignant neoplasm of breast: Secondary | ICD-10-CM

## 2021-12-21 DIAGNOSIS — Z30431 Encounter for routine checking of intrauterine contraceptive device: Secondary | ICD-10-CM

## 2021-12-31 ENCOUNTER — Ambulatory Visit
Admission: RE | Admit: 2021-12-31 | Discharge: 2021-12-31 | Disposition: A | Discharge status: Home / Self Care | Payer: BC Managed Care – PPO | Source: Ambulatory Visit | Attending: Obstetrics and Gynecology | Admitting: Obstetrics and Gynecology

## 2021-12-31 DIAGNOSIS — N644 Mastodynia: Secondary | ICD-10-CM

## 2021-12-31 DIAGNOSIS — Z1231 Encounter for screening mammogram for malignant neoplasm of breast: Secondary | ICD-10-CM | POA: Insufficient documentation

## 2022-03-15 ENCOUNTER — Encounter: Payer: Self-pay | Admitting: Family Medicine

## 2022-03-31 ENCOUNTER — Encounter: Payer: BC Managed Care – PPO | Admitting: Family Medicine

## 2022-04-22 DIAGNOSIS — Z1331 Encounter for screening for depression: Secondary | ICD-10-CM | POA: Diagnosis not present

## 2022-04-22 DIAGNOSIS — G4700 Insomnia, unspecified: Secondary | ICD-10-CM | POA: Diagnosis not present

## 2022-04-22 DIAGNOSIS — E669 Obesity, unspecified: Secondary | ICD-10-CM | POA: Diagnosis not present

## 2022-04-22 DIAGNOSIS — Z0000 Encounter for general adult medical examination without abnormal findings: Secondary | ICD-10-CM | POA: Diagnosis not present

## 2022-04-22 DIAGNOSIS — R7303 Prediabetes: Secondary | ICD-10-CM | POA: Diagnosis not present

## 2022-04-29 DIAGNOSIS — Z13220 Encounter for screening for lipoid disorders: Secondary | ICD-10-CM | POA: Diagnosis not present

## 2022-04-29 DIAGNOSIS — Z131 Encounter for screening for diabetes mellitus: Secondary | ICD-10-CM | POA: Diagnosis not present

## 2022-11-25 ENCOUNTER — Ambulatory Visit: Payer: BC Managed Care – PPO

## 2022-11-25 DIAGNOSIS — K640 First degree hemorrhoids: Secondary | ICD-10-CM

## 2022-11-25 DIAGNOSIS — Z83719 Family history of colon polyps, unspecified: Secondary | ICD-10-CM

## 2022-11-25 DIAGNOSIS — Z1211 Encounter for screening for malignant neoplasm of colon: Secondary | ICD-10-CM | POA: Diagnosis present

## 2023-07-12 ENCOUNTER — Other Ambulatory Visit: Payer: Self-pay | Admitting: Obstetrics and Gynecology

## 2023-07-12 DIAGNOSIS — Z1231 Encounter for screening mammogram for malignant neoplasm of breast: Secondary | ICD-10-CM

## 2023-08-07 ENCOUNTER — Ambulatory Visit
Admission: RE | Admit: 2023-08-07 | Discharge: 2023-08-07 | Disposition: A | Discharge status: Home / Self Care | Payer: BC Managed Care – PPO | Source: Ambulatory Visit | Attending: Obstetrics and Gynecology | Admitting: Obstetrics and Gynecology

## 2023-08-07 DIAGNOSIS — Z1231 Encounter for screening mammogram for malignant neoplasm of breast: Secondary | ICD-10-CM | POA: Diagnosis present

## 2023-08-11 ENCOUNTER — Other Ambulatory Visit: Payer: Self-pay | Admitting: Obstetrics and Gynecology

## 2023-08-11 DIAGNOSIS — N6489 Other specified disorders of breast: Secondary | ICD-10-CM

## 2023-08-11 DIAGNOSIS — R928 Other abnormal and inconclusive findings on diagnostic imaging of breast: Secondary | ICD-10-CM

## 2023-08-25 ENCOUNTER — Ambulatory Visit
Admission: RE | Admit: 2023-08-25 | Discharge: 2023-08-25 | Disposition: A | Discharge status: Home / Self Care | Payer: BC Managed Care – PPO | Source: Ambulatory Visit | Attending: Obstetrics and Gynecology | Admitting: Obstetrics and Gynecology

## 2023-08-25 DIAGNOSIS — R928 Other abnormal and inconclusive findings on diagnostic imaging of breast: Secondary | ICD-10-CM | POA: Diagnosis present

## 2023-08-25 DIAGNOSIS — N6489 Other specified disorders of breast: Secondary | ICD-10-CM | POA: Insufficient documentation

## 2024-03-11 ENCOUNTER — Other Ambulatory Visit: Payer: Self-pay

## 2024-03-11 ENCOUNTER — Emergency Department

## 2024-03-11 ENCOUNTER — Emergency Department
Admission: EM | Admit: 2024-03-11 | Discharge: 2024-03-11 | Disposition: A | Discharge status: Home / Self Care | Attending: Emergency Medicine | Admitting: Emergency Medicine

## 2024-03-11 DIAGNOSIS — R0602 Shortness of breath: Secondary | ICD-10-CM | POA: Insufficient documentation

## 2024-03-11 DIAGNOSIS — K8020 Calculus of gallbladder without cholecystitis without obstruction: Secondary | ICD-10-CM | POA: Insufficient documentation

## 2024-03-11 DIAGNOSIS — R1011 Right upper quadrant pain: Secondary | ICD-10-CM | POA: Diagnosis present

## 2024-03-11 LAB — CBC
HCT: 43.4 % (ref 36.0–46.0)
Hemoglobin: 14.6 g/dL (ref 12.0–15.0)
MCH: 29.9 pg (ref 26.0–34.0)
MCHC: 33.6 g/dL (ref 30.0–36.0)
MCV: 88.8 fL (ref 80.0–100.0)
Platelets: 275 K/uL (ref 150–400)
RBC: 4.89 MIL/uL (ref 3.87–5.11)
RDW: 11.7 % (ref 11.5–15.5)
WBC: 5.1 K/uL (ref 4.0–10.5)
nRBC: 0 % (ref 0.0–0.2)

## 2024-03-11 LAB — BASIC METABOLIC PANEL WITH GFR
Anion gap: 12 (ref 5–15)
BUN: 17 mg/dL (ref 6–20)
CO2: 25 mmol/L (ref 22–32)
Calcium: 9.5 mg/dL (ref 8.9–10.3)
Chloride: 102 mmol/L (ref 98–111)
Creatinine, Ser: 0.69 mg/dL (ref 0.44–1.00)
GFR, Estimated: >60 mL/min (ref >60)
Glucose, Bld: 101 mg/dL — ABNORMAL HIGH (ref 70–99)
Potassium: 4.2 mmol/L (ref 3.5–5.1)
Sodium: 139 mmol/L (ref 135–145)

## 2024-03-11 LAB — TROPONIN I (HIGH SENSITIVITY): Troponin I (High Sensitivity): <2 ng/L (ref <18)

## 2024-03-11 LAB — BRAIN NATRIURETIC PEPTIDE: B Natriuretic Peptide: 28 pg/mL (ref 0.0–100.0)

## 2024-03-11 LAB — LIPASE, BLOOD: Lipase: 42 U/L (ref 11–51)

## 2024-03-11 MED ORDER — HYOSCYAMINE SULFATE 0.125 MG SL SUBL: 0.1250 mg | SUBLINGUAL_TABLET | Freq: Four times a day (QID) | SUBLINGUAL | 0 refills | Status: AC | PRN

## 2024-03-11 NOTE — ED Notes (Signed)
 See triage note presents with some mid chest pain and SOB   States she woke up with these sx's this am Afebrile on arrival States the pain is also  to RUQ  Denies any nausea/vomiting

## 2024-03-11 NOTE — ED Provider Notes (Signed)
 [facility] Provider Note    Event Date/Time   First MD Initiated Contact with Patient 03/11/24 [phone]     (approximate)   History   Chest Pain   HPI  Terry Henderson is a 43 y.o. female with no significant past medical history and as listed in EMR presents to the emergency department for treatment and evaluation of centralized chest pain and shortness of breath.  She is also having right upper quadrant pain and has a history of gallstones.    Physical Exam    Vitals:   03/11/24 0901  BP: 130/89  Pulse: 80  Resp: 18  Temp: 98 F (36.7 C)  SpO2: 98%    General: Awake, no distress.  CV:  Good peripheral perfusion.  Resp:  Normal effort.  Abd:  No distention. RUQ tenderness Other:     ED Results / Procedures / Treatments   Labs (all labs ordered are listed, but only abnormal results are displayed)  Labs Reviewed  BASIC METABOLIC PANEL WITH GFR - Abnormal; Notable for the following components:      Result Value   Glucose, Bld 101 (*)    All other components within normal limits  CBC  BRAIN NATRIURETIC PEPTIDE  LIPASE, BLOOD  POC URINE PREG, ED  TROPONIN I (HIGH SENSITIVITY)     EKG  Not indicated.   RADIOLOGY  Image and radiology report reviewed and interpreted by me. Radiology report consistent with the same.  Ultrasound of the right upper quadrant shows cholelithiasis without wall thickening or sonographic Murphy sign.  Common bile duct is within the normal limits.  Liver shows no focal lesions  PROCEDURES:  Critical Care performed: No  Procedures   MEDICATIONS ORDERED IN ED:  Medications - No data to display   IMPRESSION / MDM / ASSESSMENT AND PLAN / ED COURSE   I have reviewed the triage note and vital signs. Vital signs stable.   Differential diagnosis includes, but is not limited to, gallbladder disease, cardiac event, musculoskeletal strain, ulcer  Patient's presentation is most consistent with acute  illness / injury with system symptoms.  43 year old female presenting to the emergency department for treatment and evaluation of midsternal chest pressure, right upper quadrant abdominal tenderness and feeling like she needs to draw in deep breaths.  Symptoms have been intermittent for the past few weeks but are acutely worse today.   Labs are reassuring.  CBC is normal.  BMP is normal, lipase is normal, troponin is normal, BNP is normal.  Chest x-ray is negative for acute concerns.  Ultrasound shows cholelithiasis without acute cholecystitis.  All results discussed with the patient and significant other.  She was encouraged to schedule a consult with general surgery.  She will be given a prescription for Levsin  and advised to continue taking her Tylenol  or ibuprofen .  ER return precautions discussed.  Discharged in stable condition.      FINAL CLINICAL IMPRESSION(S) / ED DIAGNOSES   Final diagnoses:  Calculus of gallbladder without cholecystitis without obstruction     Rx / DC Orders   ED Discharge Orders          Ordered    hyoscyamine  (LEVSIN  SL) 0.125 MG SL tablet  Every 6 hours PRN        03/11/24 1138             Note:  This document was prepared using Dragon voice recognition software and may include unintentional dictation errors.   Javarus Dorner B,  FNP 03/11/24 1314    Jossie Artist POUR, MD 03/12/24 [phone]

## 2024-03-11 NOTE — Discharge Instructions (Addendum)
 In addition to the prescribed medication, you may take Tylenol  or ibuprofen  if needed.  Schedule follow-up with general surgery if you choose to discuss having your gallbladder removed.  Return to the emergency department for symptoms of change or worsen if you are unable to see your primary care provider or the surgeon.

## 2024-03-11 NOTE — ED Triage Notes (Signed)
 Pt to ED via POV from home. Pt reports woke up this morning with centralized CP and SOB. Pt also reports RUQ pain with hx of gallbladder issues. Family hx of CHF. Pt reports increased swelling in hands.
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
## 2024-03-21 ENCOUNTER — Ambulatory Visit: Payer: Self-pay | Admitting: General Surgery

## 2024-03-21 ENCOUNTER — Inpatient Hospital Stay: Admission: RE | Admit: 2024-03-21 | Source: Ambulatory Visit

## 2024-03-22 ENCOUNTER — Encounter
Admission: RE | Admit: 2024-03-22 | Discharge: 2024-03-22 | Disposition: A | Discharge status: Home / Self Care | Source: Ambulatory Visit | Attending: General Surgery | Admitting: General Surgery

## 2024-03-22 ENCOUNTER — Other Ambulatory Visit: Payer: Self-pay

## 2024-03-22 DIAGNOSIS — K8020 Calculus of gallbladder without cholecystitis without obstruction: Secondary | ICD-10-CM

## 2024-03-22 DIAGNOSIS — Z01812 Encounter for preprocedural laboratory examination: Secondary | ICD-10-CM

## 2024-03-22 HISTORY — DX: Gastro-esophageal reflux disease without esophagitis: K21.9

## 2024-03-22 NOTE — Patient Instructions (Addendum)
 Your procedure is scheduled on: 03/27/24 Report to the Registration Desk on the 1st floor of the [facility]. To find out your arrival time, please call [phone] between 1PM - 3PM on: 03/26/24  If your arrival time is 6:00 am, do not arrive before that time as the [facility] entrance doors do not open until 6:00 am.  REMEMBER: Instructions that are not followed completely may result in serious medical risk, up to and including death; or upon the discretion of your surgeon and anesthesiologist your surgery may need to be rescheduled.  Do not eat food or drink any liquids after midnight the night before surgery.  No gum chewing or hard candies.  One week prior to surgery: Stop Anti-inflammatories (NSAIDS) such as Advil , Aleve, Ibuprofen , Motrin , Naproxen, Naprosyn and Aspirin based products such as Excedrin, Goody's Powder, BC Powder. You may take Tylenol  if needed for pain up until the day of surgery.  Stop ANY OVER THE COUNTER supplements until after surgery : Multivitamin  ON THE DAY OF SURGERY ONLY TAKE THESE MEDICATIONS WITH SIPS OF WATER:  none  No Alcohol for 24 hours before or after surgery.  No Smoking including e-cigarettes for 24 hours before surgery.  No chewable tobacco products for at least 6 hours before surgery.  No nicotine patches on the day of surgery.  Do not use any recreational drugs for at least a week (preferably 2 weeks) before your surgery.  Please be advised that the combination of cocaine and anesthesia may have negative outcomes, up to and including death. If you test positive for cocaine, your surgery will be cancelled.  On the morning of surgery brush your teeth with toothpaste and water, you may rinse your mouth with mouthwash if you wish. Do not swallow any toothpaste or mouthwash.  Use CHG Soap or wipes as directed on instruction sheet.  Do not wear jewelry, make-up, hairpins, clips or nail polish.  For welded (permanent) jewelry:  bracelets, anklets, waist bands, etc.  Please have this removed prior to surgery.  If it is not removed, there is a chance that hospital personnel will need to cut it off on the day of surgery.  Do not wear lotions, powders, or perfumes.   Do not shave body hair from the neck down 48 hours before surgery.  Contact lenses, hearing aids and dentures may not be worn into surgery.  Do not bring valuables to the hospital. [facility] is not responsible for any missing/lost belongings or valuables.   Notify your doctor if there is any change in your medical condition (cold, fever, infection).  Wear comfortable clothing (specific to your surgery type) to the hospital.  After surgery, you can help prevent lung complications by doing breathing exercises.  Take deep breaths and cough every 1-2 hours. Your doctor may order a device called an Incentive Spirometer to help you take deep breaths.  When coughing or sneezing, hold a pillow firmly against your incision with both hands. This is called "splinting." Doing this helps protect your incision. It also decreases belly discomfort.  If you are being admitted to the hospital overnight, leave your suitcase in the car. After surgery it may be brought to your room.  In case of increased patient census, it may be necessary for you, the patient, to continue your postoperative care in the Same Day Surgery department.  If you are being discharged the day of surgery, you will not be allowed to drive home. You will need a responsible individual  to drive you home and stay with you for 24 hours after surgery.   If you are taking public transportation, you will need to have a responsible individual with you.  Please call the Pre-admissions Testing Dept. at [phone] if you have any questions about these instructions.  Surgery Visitation Policy:  Patients having surgery or a procedure may have two visitors.  Children under the age of 81 must have an  adult with them who is not the patient.  Inpatient Visitation:    Visiting hours are 7 a.m. to 8 p.m. Up to four visitors are allowed at one time in a patient room. The visitors may rotate out with other people during the day.  One visitor age 31 or older may stay with the patient overnight and must be in the room by 8 p.m.   Merchandiser, retail to address health-related social needs:  [email or web address]                                                                                                             Preparing for Surgery with CHLORHEXIDINE  GLUCONATE (CHG) Soap  Chlorhexidine  Gluconate (CHG) Soap  o An antiseptic cleaner that kills germs and bonds with the skin to continue killing germs even after washing  o Used for showering the night before surgery and morning of surgery  Before surgery, you can play an important role by reducing the number of germs on your skin.  CHG (Chlorhexidine  gluconate) soap is an antiseptic cleanser which kills germs and bonds with the skin to continue killing germs even after washing.  Please do not use if you have an allergy to CHG or antibacterial soaps. If your skin becomes reddened/irritated stop using the CHG.  1. Shower the NIGHT BEFORE SURGERY and the MORNING OF SURGERY with CHG soap.  2. If you choose to wash your hair, wash your hair first as usual with your normal shampoo.  3. After shampooing, rinse your hair and body thoroughly to remove the shampoo.  4. Use CHG as you would any other liquid soap. You can apply CHG directly to the skin and wash gently with a scrungie or a clean washcloth.  5. Apply the CHG soap to your body only from the neck down. Do not use on open wounds or open sores. Avoid contact with your eyes, ears, mouth, and genitals (private parts). Wash face and genitals (private parts) with your normal soap.  6. Wash thoroughly, paying special attention to the area where your surgery will be  performed.  7. Thoroughly rinse your body with warm water.  8. Do not shower/wash with your normal soap after using and rinsing off the CHG soap.  9. Pat yourself dry with a clean towel.  10. Wear clean pajamas to bed the night before surgery.  12. Place clean sheets on your bed the night of your first shower and do not sleep with pets.  13. Shower again with the CHG soap on the day of surgery prior to arriving at the hospital.  14.  Do not apply any deodorants/lotions/powders.  15. Please wear clean clothes to the hospital.

## 2024-03-27 ENCOUNTER — Other Ambulatory Visit: Payer: Self-pay

## 2024-03-27 ENCOUNTER — Ambulatory Visit: Payer: Self-pay | Admitting: Urgent Care

## 2024-03-27 ENCOUNTER — Ambulatory Visit: Payer: Self-pay | Admitting: Certified Registered"

## 2024-03-27 ENCOUNTER — Encounter: Payer: Self-pay | Admitting: General Surgery

## 2024-03-27 ENCOUNTER — Encounter
Admission: RE | Disposition: A | Discharge status: Home / Self Care | Payer: Self-pay | Source: Home / Self Care | Attending: General Surgery

## 2024-03-27 ENCOUNTER — Ambulatory Visit
Admission: RE | Admit: 2024-03-27 | Discharge: 2024-03-27 | Disposition: A | Discharge status: Home / Self Care | Attending: General Surgery | Admitting: General Surgery

## 2024-03-27 DIAGNOSIS — K828 Other specified diseases of gallbladder: Secondary | ICD-10-CM | POA: Diagnosis not present

## 2024-03-27 DIAGNOSIS — Z79899 Other long term (current) drug therapy: Secondary | ICD-10-CM | POA: Insufficient documentation

## 2024-03-27 DIAGNOSIS — K219 Gastro-esophageal reflux disease without esophagitis: Secondary | ICD-10-CM | POA: Diagnosis not present

## 2024-03-27 DIAGNOSIS — Z793 Long term (current) use of hormonal contraceptives: Secondary | ICD-10-CM | POA: Diagnosis not present

## 2024-03-27 DIAGNOSIS — K8010 Calculus of gallbladder with chronic cholecystitis without obstruction: Secondary | ICD-10-CM | POA: Diagnosis not present

## 2024-03-27 DIAGNOSIS — Z01812 Encounter for preprocedural laboratory examination: Secondary | ICD-10-CM

## 2024-03-27 DIAGNOSIS — K8064 Calculus of gallbladder and bile duct with chronic cholecystitis without obstruction: Secondary | ICD-10-CM | POA: Diagnosis present

## 2024-03-27 LAB — POCT PREGNANCY, URINE: Preg Test, Ur: NEGATIVE

## 2024-03-27 SURGERY — CHOLECYSTECTOMY, ROBOT-ASSISTED, LAPAROSCOPIC
Anesthesia: General | Site: Abdomen

## 2024-03-27 MED ORDER — DROPERIDOL 2.5 MG/ML IJ SOLN
0.6250 mg | Freq: Once | INTRAMUSCULAR | Status: AC
  Administered 2024-03-27: 0.625 mg via INTRAVENOUS

## 2024-03-27 MED ORDER — SEVOFLURANE IN SOLN
RESPIRATORY_TRACT | Status: AC
Start: 2024-03-27 — End: 2024-03-27
  Filled 2024-03-27: qty 250

## 2024-03-27 MED ORDER — BUPIVACAINE-EPINEPHRINE 0.25% -1:200000 IJ SOLN
INTRAMUSCULAR | Status: DC | PRN
  Administered 2024-03-27: 30 mL

## 2024-03-27 MED ORDER — HYDROCODONE-ACETAMINOPHEN 5-325 MG PO TABS
1.0000 | ORAL_TABLET | Freq: Four times a day (QID) | ORAL | 0 refills | Status: AC | PRN
  Filled 2024-03-27: qty 12, 3d supply, fill #0

## 2024-03-27 MED ORDER — INDOCYANINE GREEN 25 MG IV SOLR
1.2500 mg | Freq: Once | INTRAVENOUS | Status: AC
  Administered 2024-03-27: 1.25 mg via INTRAVENOUS

## 2024-03-27 MED ORDER — SUGAMMADEX SODIUM 200 MG/2ML IV SOLN
INTRAVENOUS | Status: DC | PRN
  Administered 2024-03-27: 100 mg via INTRAVENOUS
  Administered 2024-03-27: 200 mg via INTRAVENOUS

## 2024-03-27 MED ORDER — PROPOFOL 1000 MG/100ML IV EMUL
INTRAVENOUS | Status: AC
  Filled 2024-03-27: qty 100

## 2024-03-27 MED ORDER — PROPOFOL 10 MG/ML IV BOLUS
INTRAVENOUS | Status: AC
  Filled 2024-03-27: qty 20

## 2024-03-27 MED ORDER — OXYCODONE HCL 5 MG/5ML PO SOLN: 5.0000 mg | Freq: Once | ORAL | Status: AC | PRN

## 2024-03-27 MED ORDER — KETOROLAC TROMETHAMINE 30 MG/ML IJ SOLN
INTRAMUSCULAR | Status: AC
  Filled 2024-03-27: qty 1

## 2024-03-27 MED ORDER — ONDANSETRON HCL 4 MG/2ML IJ SOLN
INTRAMUSCULAR | Status: DC | PRN
  Administered 2024-03-27: 4 mg via INTRAVENOUS

## 2024-03-27 MED ORDER — PROPOFOL 500 MG/50ML IV EMUL
INTRAVENOUS | Status: DC | PRN
  Administered 2024-03-27: 125 ug/kg/min via INTRAVENOUS

## 2024-03-27 MED ORDER — OXYCODONE HCL 5 MG PO TABS
5.0000 mg | ORAL_TABLET | Freq: Once | ORAL | Status: AC | PRN
  Administered 2024-03-27: 5 mg via ORAL

## 2024-03-27 MED ORDER — ORAL CARE MOUTH RINSE: 15.0000 mL | Freq: Once | OROMUCOSAL | Status: AC

## 2024-03-27 MED ORDER — PHENYLEPHRINE 80 MCG/ML (10ML) SYRINGE FOR IV PUSH (FOR BLOOD PRESSURE SUPPORT)
PREFILLED_SYRINGE | INTRAVENOUS | Status: AC
  Filled 2024-03-27: qty 10

## 2024-03-27 MED ORDER — FENTANYL CITRATE (PF) 100 MCG/2ML IJ SOLN
INTRAMUSCULAR | Status: AC
  Filled 2024-03-27: qty 2

## 2024-03-27 MED ORDER — ROCURONIUM BROMIDE 10 MG/ML (PF) SYRINGE
PREFILLED_SYRINGE | INTRAVENOUS | Status: AC
  Filled 2024-03-27: qty 10

## 2024-03-27 MED ORDER — DROPERIDOL 2.5 MG/ML IJ SOLN
INTRAMUSCULAR | Status: AC
  Filled 2024-03-27: qty 2

## 2024-03-27 MED ORDER — ROCURONIUM BROMIDE 100 MG/10ML IV SOLN
INTRAVENOUS | Status: DC | PRN
  Administered 2024-03-27: 20 mg via INTRAVENOUS
  Administered 2024-03-27: 50 mg via INTRAVENOUS

## 2024-03-27 MED ORDER — 0.9 % SODIUM CHLORIDE (POUR BTL) OPTIME
TOPICAL | Status: DC | PRN
  Administered 2024-03-27: 500 mL

## 2024-03-27 MED ORDER — LIDOCAINE HCL (PF) 2 % IJ SOLN
INTRAMUSCULAR | Status: AC
Start: 2024-03-27 — End: 2024-03-27
  Filled 2024-03-27: qty 5

## 2024-03-27 MED ORDER — MIDAZOLAM HCL 2 MG/2ML IJ SOLN
INTRAMUSCULAR | Status: DC | PRN
  Administered 2024-03-27: 2 mg via INTRAVENOUS

## 2024-03-27 MED ORDER — ONDANSETRON HCL 4 MG/2ML IJ SOLN
INTRAMUSCULAR | Status: AC
Start: 2024-03-27 — End: 2024-03-27
  Filled 2024-03-27: qty 2

## 2024-03-27 MED ORDER — PHENYLEPHRINE 80 MCG/ML (10ML) SYRINGE FOR IV PUSH (FOR BLOOD PRESSURE SUPPORT)
PREFILLED_SYRINGE | INTRAVENOUS | Status: DC | PRN
  Administered 2024-03-27: 80 ug via INTRAVENOUS

## 2024-03-27 MED ORDER — MIDAZOLAM HCL 2 MG/2ML IJ SOLN
INTRAMUSCULAR | Status: AC
  Filled 2024-03-27: qty 2

## 2024-03-27 MED ORDER — DEXAMETHASONE SODIUM PHOSPHATE 10 MG/ML IJ SOLN
INTRAMUSCULAR | Status: AC
  Filled 2024-03-27: qty 1

## 2024-03-27 MED ORDER — FENTANYL CITRATE (PF) 100 MCG/2ML IJ SOLN
INTRAMUSCULAR | Status: DC | PRN
  Administered 2024-03-27 (×2): 50 ug via INTRAVENOUS

## 2024-03-27 MED ORDER — PROPOFOL 10 MG/ML IV BOLUS
INTRAVENOUS | Status: DC | PRN
  Administered 2024-03-27: 200 mg via INTRAVENOUS

## 2024-03-27 MED ORDER — ACETAMINOPHEN 10 MG/ML IV SOLN
INTRAVENOUS | Status: AC
  Filled 2024-03-27: qty 100

## 2024-03-27 MED ORDER — CHLORHEXIDINE GLUCONATE 0.12 % MT SOLN
15.0000 mL | Freq: Once | OROMUCOSAL | Status: AC
  Administered 2024-03-27: 15 mL via OROMUCOSAL

## 2024-03-27 MED ORDER — BUPIVACAINE-EPINEPHRINE (PF) 0.25% -1:200000 IJ SOLN
INTRAMUSCULAR | Status: AC
  Filled 2024-03-27: qty 30

## 2024-03-27 MED ORDER — INDOCYANINE GREEN 25 MG IV SOLR
INTRAVENOUS | Status: AC
Start: 2024-03-27 — End: 2024-03-27
  Filled 2024-03-27: qty 10

## 2024-03-27 MED ORDER — DEXAMETHASONE SODIUM PHOSPHATE 10 MG/ML IJ SOLN
INTRAMUSCULAR | Status: DC | PRN
  Administered 2024-03-27: 10 mg via INTRAVENOUS

## 2024-03-27 MED ORDER — CEFAZOLIN SODIUM-DEXTROSE 2-4 GM/100ML-% IV SOLN
INTRAVENOUS | Status: AC
Start: 2024-03-27 — End: 2024-03-27
  Filled 2024-03-27: qty 100

## 2024-03-27 MED ORDER — LIDOCAINE HCL (CARDIAC) PF 100 MG/5ML IV SOSY
PREFILLED_SYRINGE | INTRAVENOUS | Status: DC | PRN
  Administered 2024-03-27: 100 mg via INTRAVENOUS

## 2024-03-27 MED ORDER — KETOROLAC TROMETHAMINE 30 MG/ML IJ SOLN
INTRAMUSCULAR | Status: DC | PRN
Start: 2024-03-27 — End: 2024-03-27
  Administered 2024-03-27: 30 mg via INTRAVENOUS

## 2024-03-27 MED ORDER — CHLORHEXIDINE GLUCONATE 0.12 % MT SOLN
OROMUCOSAL | Status: AC
  Filled 2024-03-27: qty 15

## 2024-03-27 MED ORDER — OXYCODONE HCL 5 MG PO TABS
ORAL_TABLET | ORAL | Status: AC
  Filled 2024-03-27: qty 1

## 2024-03-27 MED ORDER — SEVOFLURANE IN SOLN
RESPIRATORY_TRACT | Status: AC
  Filled 2024-03-27: qty 250

## 2024-03-27 MED ORDER — ACETAMINOPHEN 10 MG/ML IV SOLN
INTRAVENOUS | Status: DC | PRN
  Administered 2024-03-27: 1000 mg via INTRAVENOUS

## 2024-03-27 MED ORDER — CEFAZOLIN SODIUM-DEXTROSE 2-4 GM/100ML-% IV SOLN
2.0000 g | INTRAVENOUS | Status: AC
  Administered 2024-03-27: 2 g via INTRAVENOUS

## 2024-03-27 MED ORDER — LACTATED RINGERS IV SOLN: INTRAVENOUS | Status: DC

## 2024-03-27 MED ORDER — FENTANYL CITRATE (PF) 100 MCG/2ML IJ SOLN
25.0000 ug | INTRAMUSCULAR | Status: DC | PRN
  Administered 2024-03-27 (×2): 50 ug via INTRAVENOUS

## 2024-03-27 SURGICAL SUPPLY — 42 items
BAG PRESSURE INF REUSE 1000 (BAG) IMPLANT
CANNULA REDUCER 12-8 DVNC XI (CANNULA) ×1 IMPLANT
CAUTERY HOOK MNPLR 1.6 DVNC XI (INSTRUMENTS) ×1 IMPLANT
CLIP LIGATING HEM O LOK PURPLE (MISCELLANEOUS) IMPLANT
CLIP LIGATING HEMO O LOK GREEN (MISCELLANEOUS) ×1 IMPLANT
DEFOGGER SCOPE WARM SEASHARP (MISCELLANEOUS) ×1 IMPLANT
DERMABOND ADVANCED .7 DNX12 (GAUZE/BANDAGES/DRESSINGS) ×1 IMPLANT
DRAPE ARM DVNC X/XI (DISPOSABLE) ×4 IMPLANT
DRAPE C-ARM XRAY 36X54 (DRAPES) IMPLANT
DRAPE COLUMN DVNC XI (DISPOSABLE) ×1 IMPLANT
ELECTRODE REM PT RTRN 9FT ADLT (ELECTROSURGICAL) ×1 IMPLANT
FORCEPS BPLR 8 MD DVNC XI (FORCEP) IMPLANT
FORCEPS BPLR FENES DVNC XI (FORCEP) ×1 IMPLANT
FORCEPS PROGRASP DVNC XI (FORCEP) ×1 IMPLANT
GLOVE BIO SURGEON STRL SZ 6.5 (GLOVE) ×2 IMPLANT
GLOVE BIOGEL PI IND STRL 6.5 (GLOVE) ×2 IMPLANT
GLOVE SURG SYN 6.5 PF PI (GLOVE) ×2 IMPLANT
GOWN STRL REUS W/ TWL LRG LVL3 (GOWN DISPOSABLE) ×4 IMPLANT
GRASPER SUT TROCAR 14GX15 (MISCELLANEOUS) ×1 IMPLANT
IRRIGATOR SUCT 8 DISP DVNC XI (IRRIGATION / IRRIGATOR) IMPLANT
IV CATH ANGIO 12GX3 LT BLUE (NEEDLE) IMPLANT
IV NS 1000ML BAXH (IV SOLUTION) IMPLANT
KIT PINK PAD W/HEAD ARM REST (MISCELLANEOUS) ×1 IMPLANT
LABEL OR SOLS (LABEL) ×1 IMPLANT
MANIFOLD NEPTUNE II (INSTRUMENTS) IMPLANT
NDL HYPO 22X1.5 SAFETY MO (MISCELLANEOUS) ×1 IMPLANT
NDL INSUFFLATION 14GA 120MM (NEEDLE) ×1 IMPLANT
NEEDLE HYPO 22X1.5 SAFETY MO (MISCELLANEOUS) ×1 IMPLANT
NEEDLE INSUFFLATION 14GA 120MM (NEEDLE) ×1 IMPLANT
NS IRRIG 500ML POUR BTL (IV SOLUTION) ×1 IMPLANT
OBTURATOR OPTICALSTD 8 DVNC (TROCAR) ×1 IMPLANT
PACK LAP CHOLECYSTECTOMY (MISCELLANEOUS) ×1 IMPLANT
SEAL UNIV 5-12 XI (MISCELLANEOUS) ×4 IMPLANT
SET TUBE SMOKE EVAC HIGH FLOW (TUBING) ×1 IMPLANT
SOLUTION ELECTROSURG ANTI STCK (MISCELLANEOUS) ×1 IMPLANT
SPIKE FLUID TRANSFER (MISCELLANEOUS) ×2 IMPLANT
SPONGE T-LAP 4X18 ~~LOC~~+RFID (SPONGE) IMPLANT
SUT VICRYL 0 UR6 27IN ABS (SUTURE) ×1 IMPLANT
SUTURE MNCRL 4-0 27XMF (SUTURE) ×1 IMPLANT
SYSTEM BAG RETRIEVAL 10MM (BASKET) ×1 IMPLANT
TRAP FLUID SMOKE EVACUATOR (MISCELLANEOUS) IMPLANT
WATER STERILE IRR 500ML POUR (IV SOLUTION) ×1 IMPLANT

## 2024-03-27 NOTE — Anesthesia Preprocedure Evaluation (Signed)
 Anesthesia Evaluation  Patient identified by MRN, date of birth, ID band Patient awake    Reviewed: Allergy & Precautions, NPO status , Patient's Chart, lab work & pertinent test results  History of Anesthesia Complications Negative for: history of anesthetic complications  Airway Mallampati: III  TM Distance: >3 FB Neck ROM: full    Dental  (+) Chipped   Pulmonary neg pulmonary ROS, neg shortness of breath   Pulmonary exam normal        Cardiovascular Exercise Tolerance: Good (-) angina (-) Past MI negative cardio ROS Normal cardiovascular exam     Neuro/Psych negative neurological ROS  negative psych ROS   GI/Hepatic Neg liver ROS,GERD  Controlled,,  Endo/Other  negative endocrine ROS    Renal/GU      Musculoskeletal   Abdominal   Peds  Hematology negative hematology ROS (+)   Anesthesia Other Findings Past Medical History: No date: Abnormal Pap smear of cervix No date: Chickenpox No date: GERD (gastroesophageal reflux disease) No date: Hay fever No date: Human papilloma virus  Past Surgical History: No date: CESAREAN SECTION     Comment:  x2 07/16/2019: CESAREAN SECTION; N/A     Comment:  Procedure: CESAREAN SECTION;  Surgeon: Leonce Garnette BIRCH, MD;  Location: [facility] ORS;  Service: Obstetrics;                Laterality: N/A; No date: COLONOSCOPY     Comment:  x 2 No date: CRYOTHERAPY 04/17/2020: LIPOMA EXCISION; N/A     Comment:  Procedure: EXCISION LIPOMA, back;  Surgeon: Marolyn Nest, MD;  Location: [facility] ORS;  Service: General;                Laterality: N/A; 08/26/2019: Mirena  insert No date: WISDOM TOOTH EXTRACTION  BMI    Body Mass Index: 35.51 kg/m      Reproductive/Obstetrics negative OB ROS                              Anesthesia Physical Anesthesia Plan  ASA: 2  Anesthesia Plan: General ETT   Post-op Pain Management:     Induction: Intravenous  PONV Risk Score and Plan: Ondansetron , Dexamethasone , Midazolam  and Treatment may vary due to age or medical condition  Airway Management Planned: Oral ETT  Additional Equipment:   Intra-op Plan:   Post-operative Plan: Extubation in OR  Informed Consent: I have reviewed the patients History and Physical, chart, labs and discussed the procedure including the risks, benefits and alternatives for the proposed anesthesia with the patient or authorized representative who has indicated his/her understanding and acceptance.     Dental Advisory Given  Plan Discussed with: Anesthesiologist, CRNA and Surgeon  Anesthesia Plan Comments: (Patient consented for risks of anesthesia including but not limited to:  - adverse reactions to medications - damage to eyes, teeth, lips or other oral mucosa - nerve damage due to positioning  - sore throat or hoarseness - Damage to heart, brain, nerves, lungs, other parts of body or loss of life  Patient voiced understanding and assent.)        Anesthesia Quick Evaluation

## 2024-03-27 NOTE — Discharge Instructions (Signed)

## 2024-03-27 NOTE — Anesthesia Procedure Notes (Signed)
 Procedure Name: Intubation Date/Time: 03/27/2024 7:30 AM  Performed by: Brien Sotero PARAS, CRNAPre-anesthesia Checklist: Patient identified, Emergency Drugs available, Suction available and Patient being monitored Patient Re-evaluated:Patient Re-evaluated prior to induction Oxygen Delivery Method: Circle system utilized Preoxygenation: Pre-oxygenation with 100% oxygen Induction Type: IV induction Ventilation: Mask ventilation without difficulty Laryngoscope Size: McGrath and 3 Grade View: Grade I Tube type: Oral Tube size: 7.0 mm Number of attempts: 1 Airway Equipment and Method: Stylet and Video-laryngoscopy Placement Confirmation: ETT inserted through vocal cords under direct vision, positive ETCO2 and breath sounds checked- equal and bilateral Secured at: 21 cm Tube secured with: Tape Dental Injury: Teeth and Oropharynx as per pre-operative assessment

## 2024-03-27 NOTE — Anesthesia Postprocedure Evaluation (Signed)
 Anesthesia Post Note  Patient: Terry Henderson  Procedure(s) Performed: CHOLECYSTECTOMY, ROBOT-ASSISTED, LAPAROSCOPIC (Abdomen)  Patient location during evaluation: PACU Anesthesia Type: General Level of consciousness: awake and alert Pain management: pain level controlled Vital Signs Assessment: post-procedure vital signs reviewed and stable Respiratory status: spontaneous breathing, nonlabored ventilation and respiratory function stable Cardiovascular status: blood pressure returned to baseline and stable Postop Assessment: no apparent nausea or vomiting Anesthetic complications: no   No notable events documented.   Last Vitals:  Vitals:   03/27/24 0930 03/27/24 0945  BP: 122/72 115/76  Pulse: (!) 55 78  Resp: 12 16  Temp:    SpO2: 97% 95%    Last Pain:  Vitals:   03/27/24 0945  PainSc: 3                  Fairy POUR Art Levan

## 2024-03-27 NOTE — Transfer of Care (Signed)
 Immediate Anesthesia Transfer of Care Note  Patient: Terry Henderson  Procedure(s) Performed: CHOLECYSTECTOMY, ROBOT-ASSISTED, LAPAROSCOPIC (Abdomen)  Patient Location: PACU  Anesthesia Type:General  Level of Consciousness: awake, alert , and patient cooperative  Airway & Oxygen Therapy: Patient Spontanous Breathing and Patient connected to face mask oxygen  Post-op Assessment: Report given to RN and Post -op Vital signs reviewed and stable  Post vital signs: Reviewed and stable  Last Vitals:  Vitals Value Taken Time  BP 127/79 03/27/24 08:45  Temp    Pulse 56 03/27/24 08:48  Resp 13 03/27/24 08:48  SpO2 100 % 03/27/24 08:48  Vitals shown include unfiled device data.  Last Pain:  Vitals:   03/27/24 0635  PainSc: 4          Complications: No notable events documented.

## 2024-03-27 NOTE — Op Note (Signed)
 Preoperative diagnosis: Symptomatic cholelithiasis  Postoperative diagnosis: Same  Procedure: Robotic Assisted Laparoscopic Cholecystectomy.   Anesthesia: GETA   Surgeon: Dr. Cesar Coe  Wound Classification: Clean Contaminated  Indications: Patient is a 43 y.o. female developed right upper quadrant pain and on workup was found to have cholelithiasis with a normal common duct. Robotic Assisted Laparoscopic cholecystectomy was elected.  Findings:  Critical view of safety achieved Cystic duct and artery identified, ligated and divided Adequate hemostasis  Description of procedure: The patient was placed on the operating table in the supine position. General anesthesia was induced. A time-out was completed verifying correct patient, procedure, site, positioning, and implant(s) and/or special equipment prior to beginning this procedure. An orogastric tube was placed. The abdomen was prepped and draped in the usual sterile fashion.  An incision was made in a natural skin line below the umbilicus.  The fascia was elevated and the Veress needle inserted. Proper position was confirmed by aspiration and saline meniscus test.  The abdomen was insufflated with carbon dioxide to a pressure of 15 mmHg. The patient tolerated insufflation well. A 8-mm trocar was then inserted in optiview fashion.  The laparoscope was inserted and the abdomen inspected. No injuries from initial trocar placement were noted. Additional trocars were then inserted in the following locations: an 8-mm trocar in the left lateral abdomen, and another two 8-mm trocars to the right side of the abdomen 5 cm appart. The umbilical trocar was changed to a 12 mm trocar all under direct visualization. The abdomen was inspected and no abnormalities were found. The table was placed in the reverse Trendelenburg position with the right side up. The robotic arms were docked and target anatomy identified. Instrument inserted under direct  visualization.  Filmy adhesions between the gallbladder and omentum, duodenum and transverse colon were lysed with electrocautery. The dome of the gallbladder was grasped with a prograsp and retracted over the dome of the liver. The infundibulum was also grasped with an atraumatic grasper and retracted toward the right lower quadrant. This maneuver exposed Calot's triangle. The peritoneum overlying the gallbladder infundibulum was then incised and the cystic duct and cystic artery identified and circumferentially dissected. Critical view of safety reviewed before ligating any structure. Firefly images taken to visualize biliary ducts. The cystic duct and cystic artery were then doubly clipped and divided close to the gallbladder.  The gallbladder was then dissected from its peritoneal attachments by electrocautery. Hemostasis was checked and the gallbladder and contained stones were removed using an endoscopic retrieval bag. The gallbladder was passed off the table as a specimen. There was no evidence of bleeding from the gallbladder fossa or cystic artery or leakage of the bile from the cystic duct stump. Secondary trocars were removed under direct vision. No bleeding was noted. The robotic arms were undoked. The scope was withdrawn and the umbilical trocar removed. The abdomen was allowed to collapse. The fascia of the 12mm trocar sites was closed with figure-of-eight 0 vicryl sutures. The skin was closed with subcuticular sutures of 4-0 monocryl and topical skin adhesive. The orogastric tube was removed.  The patient tolerated the procedure well and was taken to the postanesthesia care unit in stable condition.   Specimen: Gallbladder  Complications: None  EBL: 5 mL

## 2024-03-27 NOTE — Interval H&P Note (Signed)
 History and Physical Interval Note:  03/27/2024 7:12 AM  Terry Henderson  has presented today for surgery, with the diagnosis of K80.20 cholelithiasis with biliary colic.  The various methods of treatment have been discussed with the patient and family. After consideration of risks, benefits and other options for treatment, the patient has consented to  Procedure(s): CHOLECYSTECTOMY, ROBOT-ASSISTED, LAPAROSCOPIC (N/A) as a surgical intervention.  The patient's history has been reviewed, patient examined, no change in status, stable for surgery.  I have reviewed the patient's chart and labs.  Questions were answered to the patient's satisfaction.     Lucas Sjogren

## 2024-03-28 LAB — SURGICAL PATHOLOGY
